# Patient Record
Sex: Male | Born: 1995 | Race: White | Hispanic: No | Marital: Married | Smoking: Former smoker
Health system: Southern US, Community
[De-identification: ages and names within clinical notes are randomized; demographics above are authoritative.]

## PROBLEM LIST (undated history)

## (undated) DIAGNOSIS — R519 Headache, unspecified: Secondary | ICD-10-CM

## (undated) DIAGNOSIS — N200 Calculus of kidney: Secondary | ICD-10-CM

## (undated) DIAGNOSIS — F419 Anxiety disorder, unspecified: Secondary | ICD-10-CM

## (undated) HISTORY — PX: EYE SURGERY: SHX253

---
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
## 2013-02-13 ENCOUNTER — Encounter (HOSPITAL_COMMUNITY): Payer: Self-pay | Admitting: Emergency Medicine

## 2013-02-13 ENCOUNTER — Emergency Department (HOSPITAL_COMMUNITY)
Admission: EM | Admit: 2013-02-13 | Discharge: 2013-02-13 | Disposition: A | Discharge status: Home / Self Care | Payer: BC Managed Care – PPO | Attending: Emergency Medicine | Admitting: Emergency Medicine

## 2013-02-13 DIAGNOSIS — R110 Nausea: Secondary | ICD-10-CM | POA: Insufficient documentation

## 2013-02-13 DIAGNOSIS — R109 Unspecified abdominal pain: Secondary | ICD-10-CM | POA: Insufficient documentation

## 2013-02-13 DIAGNOSIS — Z79899 Other long term (current) drug therapy: Secondary | ICD-10-CM | POA: Insufficient documentation

## 2013-02-13 DIAGNOSIS — N453 Epididymo-orchitis: Secondary | ICD-10-CM | POA: Insufficient documentation

## 2013-02-13 DIAGNOSIS — F17200 Nicotine dependence, unspecified, uncomplicated: Secondary | ICD-10-CM | POA: Insufficient documentation

## 2013-02-13 DIAGNOSIS — N451 Epididymitis: Secondary | ICD-10-CM

## 2013-02-13 MED ORDER — HYDROCODONE-ACETAMINOPHEN 5-325 MG PO TABS: 2.0000 | ORAL_TABLET | Freq: Once | ORAL | Status: DC

## 2013-02-13 MED ORDER — IBUPROFEN 400 MG PO TABS
600.0000 mg | ORAL_TABLET | Freq: Once | ORAL | Status: AC
  Administered 2013-02-13: 600 mg via ORAL
  Filled 2013-02-13: qty 2

## 2013-02-13 MED ORDER — LIDOCAINE HCL (PF) 1 % IJ SOLN
2.0000 mL | Freq: Once | INTRAMUSCULAR | Status: AC
  Administered 2013-02-13: 2 mL via INTRADERMAL
  Filled 2013-02-13: qty 5

## 2013-02-13 MED ORDER — DOXYCYCLINE HYCLATE 100 MG PO TABS: 100.0000 mg | ORAL_TABLET | Freq: Two times a day (BID) | ORAL | Status: DC

## 2013-02-13 MED ORDER — DOXYCYCLINE HYCLATE 100 MG PO TABS
100.0000 mg | ORAL_TABLET | Freq: Once | ORAL | Status: AC
  Administered 2013-02-13: 100 mg via ORAL
  Filled 2013-02-13: qty 1

## 2013-02-13 MED ORDER — IBUPROFEN 600 MG PO TABS: 600.0000 mg | ORAL_TABLET | Freq: Four times a day (QID) | ORAL | Status: DC | PRN

## 2013-02-13 MED ORDER — CEFTRIAXONE SODIUM 250 MG IJ SOLR
250.0000 mg | Freq: Once | INTRAMUSCULAR | Status: AC
  Administered 2013-02-13: 250 mg via INTRAMUSCULAR

## 2013-02-13 NOTE — ED Notes (Signed)
Pt lifts a lot. States started feeling swelling and pain to right groin yesterday. Hurts to move in any way. States can only feel it when he stands up. Grimacing noted.

## 2013-02-13 NOTE — ED Provider Notes (Signed)
CSN: [id number]     Arrival date & time 02/13/13  0807 History  This chart was scribed for Dagmar Hait, MD, by Yevette Edwards, ED Scribe. This patient was seen in room APA12/APA12 and the patient's care was started at 8:17 AM.   First MD Initiated Contact with Patient 02/13/13 [phone]     Chief Complaint  Patient presents with  . Inguinal Hernia   (Consider location/radiation/quality/duration/timing/severity/associated sxs/prior Treatment) Patient is a 17 y.o. male presenting with groin pain. The history is provided by the patient. No language interpreter was used.  Groin Pain This is a new problem. The current episode started yesterday. The problem occurs constantly. The problem has not changed since onset.Pertinent negatives include no abdominal pain and no headaches. The symptoms are aggravated by standing. Nothing relieves the symptoms.   HPI Comments: Steve Colon is a 17 y.o. male who presents to the Emergency Department complaining of a knot to his right groin which began yesterday evening. The pt reports he has experienced swelling and constant pain to the affected area as well as nausea associated with the pain. The pt states that the knot is noticeable only when he is standing, though he reports that the pain is constant. He denies testicular pain, penile pain, penile discharge, fever, emesis, or constipation. He denies a h/o prior symptoms or similar symptoms in any other locations. The pt denies any new sexual partners. He denies any recent injuries to his right extremity.  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   No past medical history on file. Past Surgical History  Procedure Laterality Date  . Eye surgery     No family history on file. History  Substance Use Topics  . Smoking status: Current Every Day Smoker    Types: Cigarettes  . Smokeless tobacco: Not on file  . Alcohol Use: No    Review of Systems  Gastrointestinal: Positive for nausea. Negative for  vomiting, abdominal pain and constipation.  Genitourinary: Negative for dysuria, discharge, penile swelling, penile pain and testicular pain.       Right-sided groin pain.  Neurological: Negative for headaches.  All other systems reviewed and are negative.   Allergies  Codeine  Home Medications   Current Outpatient Rx  Name  Route  Sig  Dispense  Refill  . omeprazole (PRILOSEC) 20 MG capsule   Oral   Take 20 mg by mouth daily.          Triage Vitals: BP 125/78  Pulse 93  Temp(Src) 98.7 F (37.1 C) (Oral)  Resp 18  SpO2 98%  Physical Exam  Nursing note and vitals reviewed. Constitutional: He is oriented to person, place, and time. He appears well-developed and well-nourished. No distress.  HENT:  Head: Normocephalic and atraumatic.  Eyes: EOM are normal.  Neck: Neck supple. No tracheal deviation present.  Cardiovascular: Normal rate.   Pulmonary/Chest: Effort normal. No respiratory distress.  Genitourinary: No penile tenderness.  Right groin pain. Right epididymis pain. No testicular pain. Cremasterics are intact. No penile pain.  Musculoskeletal: Normal range of motion.  Neurological: He is alert and oriented to person, place, and time.  Skin: Skin is warm and dry.  Psychiatric: He has a normal mood and affect. His behavior is normal.     ED Course  Procedures (including critical care time)  DIAGNOSTIC STUDIES:  Oxygen Saturation is 98% on room air, normal by my interpretation.    COORDINATION OF CARE:  8:24 AM- Discussed treatment plan with  patient, and the patient agreed to the plan.   Labs Review Labs Reviewed - No data to display Imaging Review No results found.  MDM   1. Epididymitis    R sided inguinal lymphadenopathy. No abdominal pain, fevers, N/V/D, difficulty with bowel movements. R epididymal tenderness. No testicular pain bilaterally, normal cremasterics. Will treat epididymitis with rocephin and doxycycline. Motrin given for pain.    I personally performed the services described in this documentation, which was scribed in my presence. The recorded information has been reviewed and is accurate.    Dagmar Hait, MD 02/13/13 [phone]

## 2013-03-03 ENCOUNTER — Emergency Department (HOSPITAL_COMMUNITY): Payer: BC Managed Care – PPO

## 2013-03-03 ENCOUNTER — Encounter (HOSPITAL_COMMUNITY): Payer: Self-pay | Admitting: *Deleted

## 2013-03-03 ENCOUNTER — Emergency Department (HOSPITAL_COMMUNITY)
Admission: EM | Admit: 2013-03-03 | Discharge: 2013-03-03 | Disposition: A | Discharge status: Home / Self Care | Payer: BC Managed Care – PPO | Attending: Emergency Medicine | Admitting: Emergency Medicine

## 2013-03-03 DIAGNOSIS — R319 Hematuria, unspecified: Secondary | ICD-10-CM | POA: Insufficient documentation

## 2013-03-03 DIAGNOSIS — F17200 Nicotine dependence, unspecified, uncomplicated: Secondary | ICD-10-CM | POA: Insufficient documentation

## 2013-03-03 DIAGNOSIS — Z87442 Personal history of urinary calculi: Secondary | ICD-10-CM | POA: Insufficient documentation

## 2013-03-03 HISTORY — DX: Calculus of kidney: N20.0

## 2013-03-03 LAB — CBC WITH DIFFERENTIAL/PLATELET
Basophils Absolute: 0 10*3/uL (ref 0.0–0.1)
Eosinophils Relative: 1 % (ref 0–5)
Lymphocytes Relative: 23 % (ref 12–46)
Lymphs Abs: 1.6 10*3/uL (ref 0.7–4.0)
Neutro Abs: 4.7 10*3/uL (ref 1.7–7.7)
Neutrophils Relative %: 68 % (ref 43–77)
Platelets: 134 10*3/uL — ABNORMAL LOW (ref 150–400)
RBC: 5.1 MIL/uL (ref 4.22–5.81)
RDW: 11.9 % (ref 11.5–15.5)
WBC: 7 10*3/uL (ref 4.0–10.5)

## 2013-03-03 LAB — URINE MICROSCOPIC-ADD ON

## 2013-03-03 LAB — BASIC METABOLIC PANEL
CO2: 29 mEq/L (ref 19–32)
Calcium: 10.3 mg/dL (ref 8.4–10.5)
Glucose, Bld: 94 mg/dL (ref 70–99)
Potassium: 4.1 mEq/L (ref 3.5–5.1)
Sodium: 141 mEq/L (ref 135–145)

## 2013-03-03 LAB — URINALYSIS, ROUTINE W REFLEX MICROSCOPIC
Bilirubin Urine: NEGATIVE
Leukocytes, UA: NEGATIVE
Protein, ur: NEGATIVE mg/dL
Specific Gravity, Urine: 1.02 (ref 1.005–1.030)

## 2013-03-03 MED ORDER — HYDROMORPHONE HCL PF 1 MG/ML IJ SOLN
0.5000 mg | Freq: Once | INTRAMUSCULAR | Status: AC
  Administered 2013-03-03: 0.5 mg via INTRAVENOUS
  Filled 2013-03-03: qty 1

## 2013-03-03 MED ORDER — ONDANSETRON HCL 4 MG/2ML IJ SOLN
4.0000 mg | Freq: Once | INTRAMUSCULAR | Status: AC
  Administered 2013-03-03: 4 mg via INTRAVENOUS
  Filled 2013-03-03: qty 2

## 2013-03-03 MED ORDER — HYDROCODONE-ACETAMINOPHEN 5-325 MG PO TABS: 1.0000 | ORAL_TABLET | Freq: Four times a day (QID) | ORAL | Status: DC | PRN

## 2013-03-03 MED ORDER — CEPHALEXIN 500 MG PO CAPS: 500.0000 mg | ORAL_CAPSULE | Freq: Four times a day (QID) | ORAL | Status: DC

## 2013-03-03 MED ORDER — KETOROLAC TROMETHAMINE 30 MG/ML IJ SOLN
30.0000 mg | Freq: Once | INTRAMUSCULAR | Status: AC
  Administered 2013-03-03: 30 mg via INTRAVENOUS
  Filled 2013-03-03: qty 1

## 2013-03-03 NOTE — ED Notes (Signed)
Pain rt low back since Thursday, alert, says he has hx of kidney stones.  Urine is darker than usual.

## 2013-03-03 NOTE — ED Provider Notes (Signed)
CSN: [id number]     Arrival date & time 03/03/13  1055 History  This chart was scribed for Benny Lennert, MD by Quintella Reichert, ED scribe.  This patient was seen in room APA05/APA05 and the patient's care was started at 1:56 PM.    Chief Complaint  Patient presents with  . Flank Pain    Patient is a 17 y.o. male presenting with flank pain. The history is provided by the patient. No language interpreter was used.  Flank Pain This is a new problem. The current episode started 2 days ago. The problem occurs constantly. The problem has been gradually worsening. Pertinent negatives include no chest pain, no abdominal pain, no headaches and no shortness of breath. Associated symptoms comments: Dark urine.  No fever, chills, nausea or vomiting.Marland Kitchen He has tried nothing for the symptoms.    HPI Comments: Steve Colon is a 17 y.o. male with no chronic medical conditions who presents to the Emergency Department complaining of sudden-onset severe right flank pain that began 2 days ago.  Pt also reports his urine is darker than usual.  He denies fever, chills, nausea or vomiting.  He denies prior h/o kidney stones.  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  He does not know what treatment plan was advised and he has not followed up since this diagnosis.  Former PCP is Dr. Dimas Aguas Pt has no PCP presently since losing his insurance   Past Medical History  Diagnosis Date  . Kidney stones     Past Surgical History  Procedure Laterality Date  . Eye surgery      History reviewed. No pertinent family history.  History  Substance Use Topics  . Smoking status: Current Every Day Smoker    Types: Cigarettes  . Smokeless tobacco: Not on file  . Alcohol Use: No     Review of Systems  Constitutional: Negative for appetite change and fatigue.  HENT: Negative for congestion, sinus pressure and ear discharge.   Eyes: Negative for  discharge.  Respiratory: Negative for cough and shortness of breath.   Cardiovascular: Negative for chest pain.  Gastrointestinal: Negative for abdominal pain and diarrhea.  Genitourinary: Positive for flank pain. Negative for frequency.       Dark urine  Musculoskeletal: Negative for back pain.  Skin: Negative for rash.  Neurological: Negative for seizures and headaches.  Psychiatric/Behavioral: Negative for hallucinations.    Allergies  Codeine and Tramadol  Home Medications  No current outpatient prescriptions on file.  BP 126/67  Pulse 67  Temp(Src) 98.5 F (36.9 C) (Oral)  Resp 16  Ht 5\' 6"  (1.676 m)  Wt 130 lb (58.968 kg)  BMI 20.99 kg/m2  SpO2 97%  Physical Exam  Nursing note and vitals reviewed. Constitutional: He is oriented to person, place, and time. He appears well-developed.  HENT:  Head: Normocephalic.  Eyes: Conjunctivae and EOM are normal. No scleral icterus.  Neck: Neck supple. No thyromegaly present.  Cardiovascular: Normal rate and regular rhythm.  Exam reveals no gallop and no friction rub.   No murmur heard. Pulmonary/Chest: No stridor. He has no wheezes. He has no rales. He exhibits no tenderness.  Abdominal: Soft. He exhibits no distension. There is tenderness. There is no rebound.  Moderate right flank tenderness  Musculoskeletal: Normal range of motion. He exhibits no edema.  Lymphadenopathy:    He has no cervical adenopathy.  Neurological: He is  oriented to person, place, and time. Coordination normal.  Skin: No rash noted. No erythema.  Psychiatric: He has a normal mood and affect. His behavior is normal.    ED Course  Procedures (including critical care time)  DIAGNOSTIC STUDIES: Oxygen Saturation is 97% on room air, normal by my interpretation.    COORDINATION OF CARE: 1:59 PM-Discussed treatment plan which includes pain medication, UA and abdomen CT with pt at bedside and pt agreed to plan.   3:54 PM: Informed pt that CT confirms  kidney malrotation and UA reveals some blood in his urine .  Discussed treatment plan which includes pain medication, antibiotics, and referral to urologist.  Pt expressed understanding and agreed to plan.   Labs Review Labs Reviewed  URINALYSIS, ROUTINE W REFLEX MICROSCOPIC - Abnormal; Notable for the following:    APPearance HAZY (*)    Hgb urine dipstick LARGE (*)    All other components within normal limits  URINE MICROSCOPIC-ADD ON - Abnormal; Notable for the following:    Squamous Epithelial / LPF FEW (*)    All other components within normal limits  CBC WITH DIFFERENTIAL - Abnormal; Notable for the following:    Platelets 134 (*)    All other components within normal limits  BASIC METABOLIC PANEL    Imaging Review Ct Abdomen Pelvis Wo Contrast  03/03/2013   CLINICAL DATA:  Right flank pain.  EXAM: CT ABDOMEN AND PELVIS WITHOUT CONTRAST  TECHNIQUE: Multidetector CT imaging of the abdomen and pelvis was performed following the standard protocol without intravenous contrast.  COMPARISON:  CT E 02/24/2013 [facility].  FINDINGS: Renal: The right kidney is malrotated. There is a 4 mm calculus towards the upper pole. There is a 2 mm calculus towards the lower pole of the right kidney. The right renal pelvis is patulous. This could represent an extrarenal pelvis and is unchanged from prior. No clear evidence of hydronephrosis. The right ureter is normal caliber without evidence of ureteral calculus. Left kidney is normal in shape and position. There is a small left extrarenal pelvis. No left ureterolithiasis or obstructive uropathy.  Lung bases are clear. No focal hepatic lesion. The stomach, small bowel, appendix, cecum are normal. The appendix is filled with high-density from recent CT. Rectosigmoid colon is normal.  Abdominal aorta is normal in caliber. No retroperitoneal or periportal lymphadenopathy.  No free fluid the pelvis. No distal ureteral stones or bladder stones. Prostate gland  is normal. No pelvic lymphadenopathy. Review of bone windows demonstrates no aggressive osseous lesions.  IMPRESSION: 1. 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  2. Two nonobstructing right renal calculi. No ureterolithiasis on the left or right.   Electronically Signed   By: Genevive Bi M.D.   On: 03/03/2013 15:05    MDM  No diagnosis found.    The chart was scribed for me under my direct supervision.  I personally performed the history, physical, and medical decision making and all procedures in the evaluation of this patient.Benny Lennert, MD 03/03/13 [phone]

## 2013-03-04 LAB — URINE CULTURE
Colony Count: NO GROWTH
Culture: NO GROWTH

## 2013-03-11 ENCOUNTER — Ambulatory Visit (INDEPENDENT_AMBULATORY_CARE_PROVIDER_SITE_OTHER): Payer: BC Managed Care – PPO | Admitting: Urology

## 2013-03-11 ENCOUNTER — Other Ambulatory Visit: Payer: Self-pay | Admitting: Urology

## 2013-03-11 DIAGNOSIS — N1330 Unspecified hydronephrosis: Secondary | ICD-10-CM

## 2013-03-12 ENCOUNTER — Ambulatory Visit (HOSPITAL_COMMUNITY): Payer: BC Managed Care – PPO

## 2013-03-17 ENCOUNTER — Ambulatory Visit (HOSPITAL_COMMUNITY)
Admission: RE | Admit: 2013-03-17 | Discharge: 2013-03-17 | Disposition: A | Discharge status: Home / Self Care | Payer: BC Managed Care – PPO | Source: Ambulatory Visit | Attending: Urology | Admitting: Urology

## 2013-03-17 DIAGNOSIS — N201 Calculus of ureter: Secondary | ICD-10-CM | POA: Insufficient documentation

## 2013-03-17 DIAGNOSIS — N1330 Unspecified hydronephrosis: Secondary | ICD-10-CM | POA: Insufficient documentation

## 2013-04-08 ENCOUNTER — Other Ambulatory Visit (HOSPITAL_COMMUNITY): Payer: Self-pay | Admitting: Urology

## 2013-04-08 DIAGNOSIS — N135 Crossing vessel and stricture of ureter without hydronephrosis: Secondary | ICD-10-CM

## 2013-04-09 ENCOUNTER — Other Ambulatory Visit: Payer: Self-pay | Admitting: Urology

## 2013-04-18 ENCOUNTER — Encounter (HOSPITAL_COMMUNITY): Payer: Self-pay | Admitting: Pharmacy Technician

## 2013-04-22 ENCOUNTER — Other Ambulatory Visit (HOSPITAL_COMMUNITY): Payer: Self-pay | Admitting: *Deleted

## 2013-04-23 ENCOUNTER — Encounter (HOSPITAL_COMMUNITY)
Admission: RE | Admit: 2013-04-23 | Discharge: 2013-04-23 | Disposition: A | Discharge status: Home / Self Care | Payer: BC Managed Care – PPO | Source: Ambulatory Visit | Attending: Urology | Admitting: Urology

## 2013-04-23 ENCOUNTER — Encounter (HOSPITAL_COMMUNITY): Payer: Self-pay

## 2013-04-23 DIAGNOSIS — Z01818 Encounter for other preprocedural examination: Secondary | ICD-10-CM | POA: Insufficient documentation

## 2013-04-23 DIAGNOSIS — Z01812 Encounter for preprocedural laboratory examination: Secondary | ICD-10-CM | POA: Insufficient documentation

## 2013-04-23 DIAGNOSIS — Z01810 Encounter for preprocedural cardiovascular examination: Secondary | ICD-10-CM | POA: Insufficient documentation

## 2013-04-23 LAB — CBC
HCT: 44.6 % (ref 39.0–52.0)
MCH: 30.9 pg (ref 26.0–34.0)
MCHC: 33.9 g/dL (ref 30.0–36.0)
MCV: 91.4 fL (ref 78.0–100.0)
Platelets: 152 10*3/uL (ref 150–400)
RBC: 4.88 MIL/uL (ref 4.22–5.81)
RDW: 11.9 % (ref 11.5–15.5)

## 2013-04-23 LAB — ABO/RH: ABO/RH(D): O POS

## 2013-04-23 LAB — BASIC METABOLIC PANEL
BUN: 13 mg/dL (ref 6–23)
CO2: 28 mEq/L (ref 19–32)
Calcium: 10 mg/dL (ref 8.4–10.5)
Chloride: 104 mEq/L (ref 96–112)
Creatinine, Ser: 0.86 mg/dL (ref 0.50–1.35)
GFR calc Af Amer: >90 mL/min (ref >90)
GFR calc non Af Amer: >90 mL/min (ref >90)
Sodium: 140 mEq/L (ref 135–145)

## 2013-04-23 NOTE — Patient Instructions (Addendum)
[address] RIT[address] LECOMTE  04/23/2013   Your procedure is scheduled on: Monday November 10th  Report to [facility] at 515  AM.  Call this number if you have problems the morning of surgery [phone]   Remember: follow all bowel pre instructions from dr borden   Do not eat food :After Midnight. Saturday night,               Clear liquids all day Sunday 04-27-13 per dr Laverle Patter, no liquid after midnight Sunday night.    Take these medicines the morning of surgery with A SIP OF WATER: no meds to take                                SEE [facility] PREPARING FOR SURGERY SHEET             You may not have any metal on your body including hair pins and piercings  Do not wear jewelry, make-up.  Do not wear lotions, powders, or perfumes. You may wear deodorant.   Men may shave face and neck.  Do not bring valuables to the hospital. [facility] IS NOT RESPONSIBLE FOR VALUEABLES.  Contacts, dentures or bridgework may not be worn into surgery.  Leave suitcase in the car. After surgery it may be brought to your room.  For patients admitted to the hospital, checkout time is 11:00 AM the day of discharge.   Patients discharged the day of surgery will not be allowed to drive home.  Name and phone number of your driver:  Special Instructions: N/A   Please read over the following fact sheets that you were given:   Call Cain Sieve RN pre op nurse if needed [phone]    FAILURE TO FOLLOW THESE INSTRUCTIONS MAY RESULT IN THE CANCELLATION OF YOUR SURGERY.  PATIENT SIGNATURE___________________________________________  NURSE SIGNATURE_____________________________________________

## 2013-04-23 NOTE — Progress Notes (Signed)
Chest x ray 2 view [facility] 01-07-13 on chart

## 2013-04-24 ENCOUNTER — Encounter (HOSPITAL_COMMUNITY)
Admission: RE | Admit: 2013-04-24 | Discharge: 2013-04-24 | Disposition: A | Discharge status: Home / Self Care | Payer: BC Managed Care – PPO | Source: Ambulatory Visit | Attending: Urology | Admitting: Urology

## 2013-04-24 ENCOUNTER — Encounter (HOSPITAL_COMMUNITY): Payer: Self-pay

## 2013-04-24 DIAGNOSIS — N135 Crossing vessel and stricture of ureter without hydronephrosis: Secondary | ICD-10-CM | POA: Insufficient documentation

## 2013-04-24 MED ORDER — TECHNETIUM TC 99M MERTIATIDE
15.6000 | Freq: Once | INTRAVENOUS | Status: AC | PRN
  Administered 2013-04-24: 15.6 via INTRAVENOUS

## 2013-04-24 MED ORDER — FUROSEMIDE 10 MG/ML IJ SOLN
30.0000 mg | Freq: Once | INTRAMUSCULAR | Status: AC
  Administered 2013-04-24: 30 mg via INTRAVENOUS
  Filled 2013-04-24: qty 4

## 2013-04-26 NOTE — H&P (Signed)
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 Marcine Matar PCP: None   History of Present Illness  Steve Colon is a 17 year old patient who has a long standing history of intermitent right flank pain.  He states that this has been going on for the last 3 or 4 years intermittently and occur approximately twice per month.  He has not noted any associated risk factors for his pain or any exacerbating issues.  He also notes no factors that will improve his symptoms once they develop.  Over the past few months, his pain became more severe and occurred more frequently.  He presented to the [facility] ED in early September for severe right flank pain and was found to have right hydronephrosis with a malrotated right kidney and evidence of mild cortical thinning of the right kidney consistent with probable right UPJ obstruction.  He was taken to the OR by Dr. Wendall Stade who performed right retrograde pyelography demonstrating a normal caliber right ureter with a transition point at the right UPJ with right hydronephrosis. A ureteral stent was placed. He had some initial relief of his pain but has since continued to have pain likely related to his stent.  He states that his pain at this point is pretty similar to how was prior to his stent being placed and then have lower urinary tract symptoms related to the stent including urinary frequency and dysuria.  He currently has been taking Dilaudid for pain control.  His CT scan also incidentally demonstrated two renal calculi in the right kidney measuring 2 and 4 mm.  He has denied any hematuria, history of urinary tract infections, or symptomatic episodes of urolithiasis.  He has no history of renal dysfunction.   Past Medical History Problems  1. History of  Anxiety  (Symptom) 300.00  Surgical History Problems  1. History of  Eye Surgery  Current Meds 1. HYDROmorphone HCl 2 MG Oral Tablet; TAKE 1 TABLET Every 6 hours PRN; Therapy:  14Oct2014 to (Last Rx:14Oct2014) 2. Tamsulosin HCl 0.4 MG Oral Capsule; TAKE 1 CAPSULE Bedtime; Therapy: 30Sep2014  to (Last Rx:30Sep2014)  Requested for: 30Sep2014  Allergies Medication  1. Toradol Oral TABS 2. Tramadol   He states that he has had anaphylaxis to both Toradol and tramadol.   Family History Problems  1. Family history of  Nephrolithiasis  Social History Problems    Marital History - Single   Occupation: [employer]   Tobacco Use 305.1 1/2 pack for 2 years Denied    History of  Alcohol Use  Review of Systems Constitutional, skin, eye, otolaryngeal, hematologic/lymphatic, cardiovascular, pulmonary, endocrine, musculoskeletal, gastrointestinal, neurological and psychiatric system(s) were reviewed and pertinent findings if present are noted.  Genitourinary: urinary frequency and dysuria.  Gastrointestinal: no nausea and no vomiting.  Constitutional: no fever.  Cardiovascular: no leg swelling.  Musculoskeletal: back pain.    Vitals Vital Signs [Data Includes: Last 1 Day]  21Oct2014 11:04AM  Blood Pressure: 121 / 80 Heart Rate: 76  Physical Exam Constitutional: Well nourished and well developed . No acute distress.  ENT:. The ears and nose are normal in appearance.  Neck: The appearance of the neck is normal and no neck mass is present.  Pulmonary: No respiratory distress, normal respiratory rhythm and effort and clear bilateral breath sounds.  Cardiovascular: Heart rate and  rhythm are normal . No peripheral edema.  Abdomen: The abdomen is flat. The abdomen is soft and nontender. No masses are palpated. No hernias are palpable. No hepatosplenomegaly noted. He had severe tenderness on palpation of his right flank even when barely pushing on his flank.  Lymphatics: The femoral and inguinal  nodes are not enlarged or tender.  Skin: Normal skin turgor, no visible rash and no visible skin lesions.  Neuro/Psych:. Mood and affect are appropriate.    Results/Data Urine [Data Includes: Last 1 Day]   21Oct2014  COLOR AMBER   APPEARANCE CLEAR   SPECIFIC GRAVITY 1.025   pH 6.0   GLUCOSE NEG mg/dL  BILIRUBIN NEG   KETONE NEG mg/dL  BLOOD LARGE   PROTEIN 30 mg/dL  UROBILINOGEN 0.2 mg/dL  NITRITE NEG   LEUKOCYTE ESTERASE TRACE   SQUAMOUS EPITHELIAL/HPF RARE   WBC 3-6 WBC/hpf  RBC 21-50 RBC/hpf  BACTERIA RARE   CRYSTALS NONE SEEN   CASTS NONE SEEN   Other MUCUS NOTED     I have reviewed his medical records and independently reviewed his CT scan with contrast and a CT scan without contrast as well as retrograde pyelography studies.  Findings are as dictated above.   Assessment Assessed  1. 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 Diazepam 10 MG Oral Tablet; TAKE 1 TABLET As Directed Take 30 min prior to  procedure; Therapy: 21Oct2014 to (Complete:22Oct2014); Last Rx:21Oct2014 2. HYDROmorphone HCl 2 MG Oral Tablet; TAKE 1 TABLET EVERY 3 TO 4 HOURS AS  NEEDED FOR PAIN; Therapy: 21Oct2014 to (Evaluate:26Oct2014); Last Rx:21Oct2014 3. Cysto Removal JJ(s)  Requested for: 29Oct2014 4. Follow-up Office  Follow-up  Requested for: 29Oct2014 Health Maintenance (V70.0)  5. UA With REFLEX  Done: 21Oct2014 10:44AM  Discussion/Summary     1.  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  This certainly could be an explanation for his pain symptoms which he has been experiencing intermittently for the last few years.  However, his pain symptoms are quite unusual and his physical exam findings today are not necessarily consistent with a typical UPJ obstruction.  We  therefore discussed the distinct possibility that his pain could be related to alternative etiology and treatment of his UPJ obstruction might not improve his pain.  Furthermore, his pain has not been improved by his recent ureteral stent placement which is somewhat concerning and again is less consistent with a UPJ obstruction as the source of this pain.  Considering this fact, I have recommended that his ureteral stent be removed since it is only causing him lower urinary tract symptoms and has not improved his pain.  Following removal of the stone, he will then undergo a nuclear medicine renal scan with Lasix for further assessment.  If findings are consistent with a right UPJ obstruction, we have discussed proceeding with cystoscopy, right retrograde pyelography, right ureteral stent placement, and right robotic-assisted laparoscopic pyeloplasty along with intraoperative removal of his right renal calculi.  We have reviewed the potential risks and complications as well as the expected recovery process associated with this procedure.  We have discussed the risks including but not limited to bleeding, infection, cardiopulmonary risks associated with major surgery and anesthesia, risks of damage to adjacent organs/structures, potential loss of kidney or damage to the drainage system of the kidney, potential  failure of the procedure or the potential to have persistent pain afterwards, the potential development of stricture within the ureter, the need for further procedures, the risk of urine leak, possible risk of open surgical conversion, the potential risk for hernia formation, etc.  We discussed appropriate postoperative expectations including the need for postoperative ureteral stent and postoperative drain.  He requested and was given a prescription for Dilaudid 2 mg po q3 to 4 hours prn. # 40.  Cc: Dr. Marcine Matar Dr. Selinda Flavin Dr. Wendall Stade     Signatures Electronically signed by :  Heloise Purpura, M.D.; Apr 08 2013  2:35PM

## 2013-04-27 ENCOUNTER — Encounter (HOSPITAL_COMMUNITY): Payer: Self-pay | Admitting: Anesthesiology

## 2013-04-27 NOTE — Anesthesia Preprocedure Evaluation (Addendum)
Anesthesia Evaluation  Patient identified by MRN, date of birth, ID band Patient awake    Reviewed: Allergy & Precautions, H&P , NPO status , Patient's Chart, lab work & pertinent test results  Airway Mallampati: II TM Distance: >3 FB Neck ROM: Full    Dental no notable dental hx.    Pulmonary Current Smoker,  breath sounds clear to auscultation  Pulmonary exam normal       Cardiovascular Exercise Tolerance: Good negative cardio ROS  Rhythm:Regular Rate:Normal     Neuro/Psych negative neurological ROS  negative psych ROS   GI/Hepatic negative GI ROS, Neg liver ROS,   Endo/Other  negative endocrine ROS  Renal/GU Renal diseaseRight UPJ obstruction and right renal calculi  negative genitourinary   Musculoskeletal negative musculoskeletal ROS (+)   Abdominal   Peds negative pediatric ROS (+)  Hematology negative hematology ROS (+)   Anesthesia Other Findings   Reproductive/Obstetrics negative OB ROS                          Anesthesia Physical Anesthesia Plan  ASA: II  Anesthesia Plan: General   Post-op Pain Management:    Induction: Intravenous  Airway Management Planned: Oral ETT  Additional Equipment:   Intra-op Plan:   Post-operative Plan: Extubation in OR  Informed Consent: I have reviewed the patients History and Physical, chart, labs and discussed the procedure including the risks, benefits and alternatives for the proposed anesthesia with the patient or authorized representative who has indicated his/her understanding and acceptance.   Dental advisory given  Plan Discussed with: CRNA  Anesthesia Plan Comments:         Anesthesia Quick Evaluation

## 2013-04-28 ENCOUNTER — Ambulatory Visit (HOSPITAL_COMMUNITY): Payer: BC Managed Care – PPO

## 2013-04-28 ENCOUNTER — Encounter (HOSPITAL_COMMUNITY): Payer: BC Managed Care – PPO | Admitting: Anesthesiology

## 2013-04-28 ENCOUNTER — Encounter (HOSPITAL_COMMUNITY): Payer: Self-pay | Admitting: *Deleted

## 2013-04-28 ENCOUNTER — Observation Stay (HOSPITAL_COMMUNITY)
Admission: RE | Admit: 2013-04-28 | Discharge: 2013-04-30 | Disposition: A | Discharge status: Home / Self Care | Payer: BC Managed Care – PPO | Source: Ambulatory Visit | Attending: Urology | Admitting: Urology

## 2013-04-28 ENCOUNTER — Encounter (HOSPITAL_COMMUNITY)
Admission: RE | Disposition: A | Discharge status: Home / Self Care | Payer: Self-pay | Source: Ambulatory Visit | Attending: Urology

## 2013-04-28 ENCOUNTER — Observation Stay (HOSPITAL_COMMUNITY): Payer: BC Managed Care – PPO

## 2013-04-28 ENCOUNTER — Ambulatory Visit (HOSPITAL_COMMUNITY): Payer: BC Managed Care – PPO | Admitting: Anesthesiology

## 2013-04-28 DIAGNOSIS — Q6239 Other obstructive defects of renal pelvis and ureter: Secondary | ICD-10-CM | POA: Insufficient documentation

## 2013-04-28 DIAGNOSIS — F17200 Nicotine dependence, unspecified, uncomplicated: Secondary | ICD-10-CM | POA: Insufficient documentation

## 2013-04-28 DIAGNOSIS — F411 Generalized anxiety disorder: Secondary | ICD-10-CM | POA: Insufficient documentation

## 2013-04-28 DIAGNOSIS — Q638 Other specified congenital malformations of kidney: Secondary | ICD-10-CM | POA: Insufficient documentation

## 2013-04-28 DIAGNOSIS — R109 Unspecified abdominal pain: Secondary | ICD-10-CM | POA: Insufficient documentation

## 2013-04-28 DIAGNOSIS — Z79899 Other long term (current) drug therapy: Secondary | ICD-10-CM | POA: Insufficient documentation

## 2013-04-28 DIAGNOSIS — N200 Calculus of kidney: Principal | ICD-10-CM | POA: Insufficient documentation

## 2013-04-28 HISTORY — PX: CYSTOSCOPY W/ URETERAL STENT PLACEMENT: SHX1429

## 2013-04-28 HISTORY — PX: ROBOT ASSISTED PYELOPLASTY: SHX5143

## 2013-04-28 LAB — BASIC METABOLIC PANEL
CO2: 24 mEq/L (ref 19–32)
Calcium: 9.5 mg/dL (ref 8.4–10.5)
Creatinine, Ser: 0.99 mg/dL (ref 0.50–1.35)
GFR calc Af Amer: >90 mL/min (ref >90)
Glucose, Bld: 115 mg/dL — ABNORMAL HIGH (ref 70–99)
Potassium: 3.7 mEq/L (ref 3.5–5.1)
Sodium: 138 mEq/L (ref 135–145)

## 2013-04-28 LAB — HEMOGLOBIN AND HEMATOCRIT, BLOOD
HCT: 42.5 % (ref 39.0–52.0)
Hemoglobin: 14.9 g/dL (ref 13.0–17.0)

## 2013-04-28 LAB — TYPE AND SCREEN
ABO/RH(D): O POS
Antibody Screen: NEGATIVE

## 2013-04-28 SURGERY — ROBOTIC ASSISTED PYELOPLASTY
Anesthesia: General | Laterality: Right | Wound class: Clean

## 2013-04-28 MED ORDER — IOHEXOL 300 MG/ML  SOLN
INTRAMUSCULAR | Status: DC | PRN
  Administered 2013-04-28: 10 mL

## 2013-04-28 MED ORDER — HYDROMORPHONE HCL PF 1 MG/ML IJ SOLN
INTRAMUSCULAR | Status: AC
  Administered 2013-04-29: 1 mg via INTRAVENOUS
  Filled 2013-04-28: qty 1

## 2013-04-28 MED ORDER — PROMETHAZINE HCL 25 MG/ML IJ SOLN: 6.2500 mg | INTRAMUSCULAR | Status: DC | PRN

## 2013-04-28 MED ORDER — GLYCOPYRROLATE 0.2 MG/ML IJ SOLN
INTRAMUSCULAR | Status: DC | PRN
  Administered 2013-04-28: .2 mg via INTRAVENOUS
  Administered 2013-04-28: .4 mg via INTRAVENOUS

## 2013-04-28 MED ORDER — HYDROMORPHONE HCL PF 1 MG/ML IJ SOLN
0.2500 mg | INTRAMUSCULAR | Status: DC | PRN
  Administered 2013-04-28 (×2): 0.5 mg via INTRAVENOUS

## 2013-04-28 MED ORDER — ONDANSETRON HCL 4 MG/2ML IJ SOLN
INTRAMUSCULAR | Status: DC | PRN
  Administered 2013-04-28: 4 mg via INTRAVENOUS

## 2013-04-28 MED ORDER — NEOSTIGMINE METHYLSULFATE 1 MG/ML IJ SOLN
INTRAMUSCULAR | Status: DC | PRN
  Administered 2013-04-28: 2 mg via INTRAVENOUS
  Administered 2013-04-28: 3 mg via INTRAVENOUS

## 2013-04-28 MED ORDER — CEFAZOLIN SODIUM-DEXTROSE 2-3 GM-% IV SOLR
2.0000 g | INTRAVENOUS | Status: AC
  Administered 2013-04-28: 2 g via INTRAVENOUS

## 2013-04-28 MED ORDER — DEXAMETHASONE SODIUM PHOSPHATE 10 MG/ML IJ SOLN
INTRAMUSCULAR | Status: DC | PRN
  Administered 2013-04-28: 10 mg via INTRAVENOUS

## 2013-04-28 MED ORDER — MIDAZOLAM HCL 5 MG/5ML IJ SOLN
INTRAMUSCULAR | Status: DC | PRN
  Administered 2013-04-28: 1 mg via INTRAVENOUS
  Administered 2013-04-28: 2 mg via INTRAVENOUS
  Administered 2013-04-28: 1 mg via INTRAVENOUS

## 2013-04-28 MED ORDER — DIPHENHYDRAMINE HCL 12.5 MG/5ML PO ELIX
12.5000 mg | ORAL_SOLUTION | Freq: Four times a day (QID) | ORAL | Status: DC | PRN
  Filled 2013-04-28: qty 5

## 2013-04-28 MED ORDER — ROCURONIUM BROMIDE 100 MG/10ML IV SOLN
INTRAVENOUS | Status: DC | PRN
  Administered 2013-04-28: 40 mg via INTRAVENOUS
  Administered 2013-04-28 (×2): 30 mg via INTRAVENOUS
  Administered 2013-04-28: 10 mg via INTRAVENOUS

## 2013-04-28 MED ORDER — HYDROMORPHONE HCL PF 1 MG/ML IJ SOLN
0.5000 mg | INTRAMUSCULAR | Status: DC | PRN
  Administered 2013-04-28 – 2013-04-29 (×4): 1 mg via INTRAVENOUS
  Filled 2013-04-28 (×4): qty 1

## 2013-04-28 MED ORDER — HYDROMORPHONE HCL PF 1 MG/ML IJ SOLN
INTRAMUSCULAR | Status: DC | PRN
  Administered 2013-04-28: 0.5 mg via INTRAVENOUS
  Administered 2013-04-28: 1 mg via INTRAVENOUS
  Administered 2013-04-28: 0.5 mg via INTRAVENOUS

## 2013-04-28 MED ORDER — DOCUSATE SODIUM 100 MG PO CAPS
100.0000 mg | ORAL_CAPSULE | Freq: Two times a day (BID) | ORAL | Status: DC
  Administered 2013-04-28 – 2013-04-30 (×4): 100 mg via ORAL
  Filled 2013-04-28 (×5): qty 1

## 2013-04-28 MED ORDER — ONDANSETRON HCL 4 MG/2ML IJ SOLN: 4.0000 mg | INTRAMUSCULAR | Status: DC | PRN

## 2013-04-28 MED ORDER — ZOLPIDEM TARTRATE 5 MG PO TABS: 5.0000 mg | ORAL_TABLET | Freq: Every evening | ORAL | Status: DC | PRN

## 2013-04-28 MED ORDER — PROPOFOL 10 MG/ML IV BOLUS
INTRAVENOUS | Status: DC | PRN
  Administered 2013-04-28: 130 mg via INTRAVENOUS

## 2013-04-28 MED ORDER — LIDOCAINE HCL (CARDIAC) 20 MG/ML IV SOLN
INTRAVENOUS | Status: DC | PRN
  Administered 2013-04-28: 100 mg via INTRAVENOUS

## 2013-04-28 MED ORDER — KETAMINE HCL 10 MG/ML IJ SOLN
INTRAMUSCULAR | Status: DC | PRN
  Administered 2013-04-28 (×2): 30 mg via INTRAVENOUS

## 2013-04-28 MED ORDER — KCL IN DEXTROSE-NACL 20-5-0.45 MEQ/L-%-% IV SOLN
INTRAVENOUS | Status: DC
  Administered 2013-04-28 – 2013-04-29 (×3): via INTRAVENOUS
  Filled 2013-04-28 (×4): qty 1000

## 2013-04-28 MED ORDER — FENTANYL CITRATE 0.05 MG/ML IJ SOLN
INTRAMUSCULAR | Status: DC | PRN
  Administered 2013-04-28 (×2): 50 ug via INTRAVENOUS
  Administered 2013-04-28: 150 ug via INTRAVENOUS
  Administered 2013-04-28 (×2): 50 ug via INTRAVENOUS
  Administered 2013-04-28: 100 ug via INTRAVENOUS
  Administered 2013-04-28: 50 ug via INTRAVENOUS

## 2013-04-28 MED ORDER — ACETAMINOPHEN 10 MG/ML IV SOLN
1000.0000 mg | Freq: Four times a day (QID) | INTRAVENOUS | Status: AC
  Administered 2013-04-28 – 2013-04-29 (×4): 1000 mg via INTRAVENOUS
  Filled 2013-04-28 (×4): qty 100

## 2013-04-28 MED ORDER — ACETAMINOPHEN 325 MG PO TABS: 650.0000 mg | ORAL_TABLET | ORAL | Status: DC | PRN

## 2013-04-28 MED ORDER — CEFAZOLIN SODIUM 1-5 GM-% IV SOLN
1.0000 g | Freq: Three times a day (TID) | INTRAVENOUS | Status: AC
Start: 2013-04-28 — End: 2013-04-29
  Administered 2013-04-28 – 2013-04-29 (×2): 1 g via INTRAVENOUS
  Filled 2013-04-28 (×2): qty 50

## 2013-04-28 MED ORDER — LACTATED RINGERS IV SOLN
INTRAVENOUS | Status: DC | PRN
  Administered 2013-04-28 (×3): via INTRAVENOUS

## 2013-04-28 MED ORDER — CEFAZOLIN SODIUM-DEXTROSE 2-3 GM-% IV SOLR
INTRAVENOUS | Status: AC
  Filled 2013-04-28: qty 50

## 2013-04-28 MED ORDER — DIPHENHYDRAMINE HCL 50 MG/ML IJ SOLN
12.5000 mg | Freq: Four times a day (QID) | INTRAMUSCULAR | Status: DC | PRN
  Administered 2013-04-28 – 2013-04-30 (×5): 12.5 mg via INTRAVENOUS
  Filled 2013-04-28 (×6): qty 1

## 2013-04-28 MED ORDER — BUPIVACAINE LIPOSOME 1.3 % IJ SUSP
20.0000 mL | Freq: Once | INTRAMUSCULAR | Status: AC
Start: 2013-04-28 — End: 2013-04-28
  Administered 2013-04-28: 20 mL
  Filled 2013-04-28: qty 20

## 2013-04-28 MED ORDER — LACTATED RINGERS IR SOLN
Status: DC | PRN
  Administered 2013-04-28: 1000 mL

## 2013-04-28 MED ORDER — WATER FOR IRRIGATION, STERILE IR SOLN
Status: DC | PRN
  Administered 2013-04-28: 1000 mL via SURGICAL_CAVITY

## 2013-04-28 MED ORDER — BUPIVACAINE-EPINEPHRINE PF 0.25-1:200000 % IJ SOLN
INTRAMUSCULAR | Status: AC
  Filled 2013-04-28: qty 30

## 2013-04-28 MED ORDER — INFLUENZA VAC SPLIT QUAD 0.5 ML IM SUSP
0.5000 mL | INTRAMUSCULAR | Status: AC
  Administered 2013-04-29: 0.5 mL via INTRAMUSCULAR
  Filled 2013-04-28 (×2): qty 0.5

## 2013-04-28 SURGICAL SUPPLY — 69 items
APPLICATOR SURGIFLO ENDO (HEMOSTASIS) ×2 IMPLANT
BAG URO CATCHER STRL LF (DRAPE) ×2 IMPLANT
CANISTER OMNI JUG 16 LITER (MISCELLANEOUS) ×2 IMPLANT
CANISTER SUCTION 2500CC (MISCELLANEOUS) ×2 IMPLANT
CATH INTERMIT  6FR 70CM (CATHETERS) ×2 IMPLANT
CHLORAPREP W/TINT 26ML (MISCELLANEOUS) ×2 IMPLANT
CLIP LIGATING HEM O LOK PURPLE (MISCELLANEOUS) IMPLANT
CLIP LIGATING HEMO O LOK GREEN (MISCELLANEOUS) IMPLANT
CLIP SUT LAPRA TY ABSORB (SUTURE) ×2 IMPLANT
CLOTH BEACON ORANGE TIMEOUT ST (SAFETY) ×2 IMPLANT
CORD HIGH FREQUENCY UNIPOLAR (ELECTROSURGICAL) ×2 IMPLANT
CORDS BIPOLAR (ELECTRODE) ×2 IMPLANT
COVER TIP SHEARS 8 DVNC (MISCELLANEOUS) ×1 IMPLANT
COVER TIP SHEARS 8MM DA VINCI (MISCELLANEOUS) ×1
DECANTER SPIKE VIAL GLASS SM (MISCELLANEOUS) ×2 IMPLANT
DERMABOND ADVANCED (GAUZE/BANDAGES/DRESSINGS) ×1
DERMABOND ADVANCED .7 DNX12 (GAUZE/BANDAGES/DRESSINGS) ×1 IMPLANT
DRAIN CHANNEL 15F RND FF 3/16 (WOUND CARE) ×2 IMPLANT
DRAPE CAMERA CLOSED 9X96 (DRAPES) ×2 IMPLANT
DRAPE INCISE IOBAN 66X45 STRL (DRAPES) ×2 IMPLANT
DRAPE LAPAROSCOPIC ABDOMINAL (DRAPES) ×2 IMPLANT
DRAPE LG THREE QUARTER DISP (DRAPES) ×2 IMPLANT
DRAPE TABLE BACK 44X90 PK DISP (DRAPES) ×2 IMPLANT
DRAPE UTILITY XL STRL (DRAPES) IMPLANT
DRAPE WARM FLUID 44X44 (DRAPE) ×2 IMPLANT
ELECT REM PT RETURN 9FT ADLT (ELECTROSURGICAL) ×2
ELECTRODE REM PT RTRN 9FT ADLT (ELECTROSURGICAL) ×1 IMPLANT
EVACUATOR SILICONE 100CC (DRAIN) ×2 IMPLANT
GLOVE BIOGEL M STRL SZ7.5 (GLOVE) ×6 IMPLANT
GOWN PREVENTION PLUS LG XLONG (DISPOSABLE) ×8 IMPLANT
GOWN STRL REIN XL XLG (GOWN DISPOSABLE) ×6 IMPLANT
GUIDEWIRE STR DUAL SENSOR (WIRE) ×2 IMPLANT
HEMOSTAT SURGICEL 4X8 (HEMOSTASIS) ×2 IMPLANT
KIT ACCESSORY DA VINCI DISP (KITS) ×1
KIT ACCESSORY DVNC DISP (KITS) ×1 IMPLANT
KIT BASIN OR (CUSTOM PROCEDURE TRAY) ×2 IMPLANT
LUBRICANT JELLY ST 5GR 8946 (MISCELLANEOUS) IMPLANT
NS IRRIG 1000ML POUR BTL (IV SOLUTION) IMPLANT
PACK CYSTO (CUSTOM PROCEDURE TRAY) ×2 IMPLANT
PENCIL BUTTON HOLSTER BLD 10FT (ELECTRODE) ×2 IMPLANT
POSITIONER SURGICAL ARM (MISCELLANEOUS) ×2 IMPLANT
SET IRRIG Y TYPE TUR BLADDER L (SET/KITS/TRAYS/PACK) ×2 IMPLANT
SET TUBE IRRIG SUCTION NO TIP (IRRIGATION / IRRIGATOR) ×2 IMPLANT
SOLUTION ANTI FOG 6CC (MISCELLANEOUS) ×2 IMPLANT
SOLUTION ELECTROLUBE (MISCELLANEOUS) ×2 IMPLANT
SPONGE LAP 18X18 X RAY DECT (DISPOSABLE) IMPLANT
SPONGE LAP 4X18 X RAY DECT (DISPOSABLE) ×2 IMPLANT
STAPLER VISISTAT 35W (STAPLE) ×2 IMPLANT
STENT CONTOUR NO GW 8FR 26CM (STENTS) ×2 IMPLANT
SURGIFLO W/THROMBIN 8M KIT (HEMOSTASIS) ×2 IMPLANT
SUT ETHILON 3 0 PS 1 (SUTURE) ×2 IMPLANT
SUT MNCRL AB 4-0 PS2 18 (SUTURE) ×4 IMPLANT
SUT VIC AB 0 CT1 27 (SUTURE) ×1
SUT VIC AB 0 CT1 27XBRD ANTBC (SUTURE) ×1 IMPLANT
SUT VIC AB 0 UR5 27 (SUTURE) ×2 IMPLANT
SUT VIC AB 2-0 SH 27 (SUTURE) ×3
SUT VIC AB 2-0 SH 27X BRD (SUTURE) ×3 IMPLANT
SUT VIC AB 4-0 RB1 27 (SUTURE) ×4
SUT VIC AB 4-0 RB1 27XBRD (SUTURE) ×4 IMPLANT
SUT VICRYL 0 UR6 27IN ABS (SUTURE) ×2 IMPLANT
SYR BULB IRRIGATION 50ML (SYRINGE) IMPLANT
TOWEL OR NON WOVEN STRL DISP B (DISPOSABLE) ×2 IMPLANT
TRAY FOLEY CATH 14FRSI W/METER (CATHETERS) ×2 IMPLANT
TRAY LAP CHOLE (CUSTOM PROCEDURE TRAY) ×2 IMPLANT
TROCAR ENDOPATH XCEL 12X100 BL (ENDOMECHANICALS) ×2 IMPLANT
TROCAR XCEL 12X100 BLDLESS (ENDOMECHANICALS) ×2 IMPLANT
TUBING CONNECTING 10 (TUBING) ×2 IMPLANT
TUBING INSUFFLATION 10FT LAP (TUBING) ×2 IMPLANT
WATER STERILE IRR 1500ML POUR (IV SOLUTION) IMPLANT

## 2013-04-28 NOTE — Anesthesia Procedure Notes (Addendum)
Performed by: Edison Pace   Performed by: Edison Pace

## 2013-04-28 NOTE — Interval H&P Note (Signed)
History and Physical Interval Note:  04/28/2013 7:05 AM  Steve Colon  has presented today for surgery, with the diagnosis of RIGHT URETEROPELVIC JUNCTION OBSTRUCTION, RIGHT RENAL CALCULI  The various methods of treatment have been discussed with the patient and family. After consideration of risks, benefits and other options for treatment, the patient has consented to  Procedure(s): ROBOTIC ASSISTED PYELOPLASTY/ RIGHT PYELOLITHOTOMY (Right) CYSTOSCOPY WITH RIGHT RETROGRADE PYELOGRAM/RIGHT URETERAL STENT PLACEMENT (Right) as a surgical intervention .  The patient's history has been reviewed, patient examined, no change in status, stable for surgery.  I have reviewed the patient's chart and labs.  Questions were answered to the patient's satisfaction.     [name],LES

## 2013-04-28 NOTE — Progress Notes (Signed)
Lab and X-ray results noted.

## 2013-04-28 NOTE — Transfer of Care (Signed)
Immediate Anesthesia Transfer of Care Note  Patient: Steve Colon  Procedure(s) Performed: Procedure(s): ROBOTIC ASSISTED PYELOPLASTY/ RIGHT PYELOLITHOTOMY (Right) CYSTOSCOPY WITH RIGHT RETROGRADE PYELOGRAM/RIGHT URETERAL STENT PLACEMENT (Right)  Patient Location: PACU  Anesthesia Type:General  Level of Consciousness: sedated  Airway & Oxygen Therapy: Patient Spontanous Breathing and Patient connected to face mask oxygen  Post-op Assessment: Report given to PACU RN and Post -op Vital signs reviewed and stable  Post vital signs: Reviewed and stable  Complications: No apparent anesthesia complications

## 2013-04-28 NOTE — Progress Notes (Signed)
Portable Abdominal X-ray done.

## 2013-04-28 NOTE — Op Note (Addendum)
Preoperative diagnosis: Right ureteropelvic junction obstruction, right renal calculi  Postoperative diagnosis: Right ureteropelvic junction obstruction, right renal calculi  Procedure:  1. Cystoscopy 2. Right retrograde pyelography with interpretation 3. Right ureteral stent placement (8 x 26) 4. Right robotic-assisted laparoscopic dismembered pyeloplasty 5. Right ureterorenoscopy  Surgeon: Moody Bruins. M.D.  Assistant(s): Pecola Leisure, PA-C  Anesthesia: General  Complications: None  EBL: 125 mL  IVF:  2000 mL crystalloid  Specimens: 1. Right ureteropelvic junction  Disposition of specimens: Pathology  Intraoperative findings: Retrograde pyelography of the right ureter and renal pelvis indicated a normal caliber ureter without filling defects.  There appeared to be a high insertion of the ureter into an anteriorly malrotated kidney and renal pelvis with dilation of the renal pelvis and moderate calyceal dilation.  Drains:  1. # 15 Blake perinephric drain 2. 16 Fr Foley catheter  Indication: Steve Colon is a 17 y.o. patient with a suspected right ureteropelvic junction obstruction.  After a thorough review of the management options for their ureteropelvic junction obstruction, they elected to proceed with surgical treatment and the above procedure. The patient's pain symptoms prior to surgery were out of proportion to what would be expected with a UPJ obstruction and this was discussed with the patient and his family.  It was made clear that he may have another source for his pain beside the UPJ obstruction.  He expressed his understanding of this possibility. We have discussed the potential benefits and risks of the procedure, side effects of the proposed treatment, the likelihood of the patient achieving the goals of the procedure, and any potential problems that might occur during the procedure or recuperation. Informed consent has been obtained.  Description  of procedure:  The patient was taken to the operating room and a general anesthetic was administered. The patient was given preoperative antibiotics, placed in the dorsal lithotomy position, and prepped and draped in the usual sterile fashion. Next a preoperative timeout was performed.  Cystourethroscopy was performed.  The patient's urethra was examined and was normal. The bladder was then systematically examined in its entirety. There was no evidence for any bladder tumors, stones, or other mucosal pathology.    Attention then turned to the right ureteral orifice and a ureteral catheter was used to intubate the ureteral orifice.  Omnipaque contrast was injected through the ureteral catheter and a retrograde pyelogram was performed with findings as dictated above.  A 0.38 sensor guidewire was then advanced up the right ureter into the renal pelvis under fluoroscopic guidance.  The wire was then backloaded through the cystoscope and a ureteral stent was advance over the wire using Seldinger technique.  The stent was positioned appropriately under fluoroscopic and cystoscopic guidance.  The wire was then removed with an adequate stent curl noted in the renal pelvis as well as in the bladder. The bladder was then emptied and a 16 Fr Foley catheter was inserted.   The patient was then repositioned in the right modified flank position and prepped and draped in the usual sterile fashion. A site was selected on the right side of the umbilicus for placement of the camera port. This was placed using a standard open Hassan technique which allowed entry into the peritoneal cavity under direct vision and without difficulty. A 12 mm port was placed and a pneumoperitoneum established. The camera was then used to inspect the abdomen and there was no evidence of any intra-abdominal injuries or other abnormalities. The remaining abdominal ports were then  placed. 8 mm robotic ports were placed in the ipsilateral upper  quadrant, lower quadrant, and far lateral abdominal wall. A 12 mm port was placed in the upper midline for laparoscopic assistance. All ports were placed under direct vision without difficulty. The surgical cart was then docked.   Utilizing the cautery scissors, the white line of Toldt was incised allowing the plane between the mesocolon and the anterior layer of Gerota's fascia to be developed and the kidney exposed.  The ureter and gonadal vein were identified inferiorly and the ureter was lifted anteriorly off the psoas muscle.  Dissection proceeded superiorly along the gonadal vein until the main renal hilum and renal pelvis was identified.  The ureteropelvic junction was isolated from the surrounding structures with a combination of sharp and blunt dissection.    The ureter entered the renal pelvis in a higher than normal insertion and the kidney was malrotated anteriorly with the renal pelvis located immediately anterior to the kidney. During the dissection of the posterior aspect of the lower renal pelvis, there was noted to be diffuse parenchymal bleeding from the surface of the kidney where the pelvis had been dissected away from the kidney. This was controlled with surgicel followed by placement of Surgiflo and a 2-0 vicryl vertical mattress suture which was secured with Lapra-Tys.  The ureteropelvic junction was divided allowing exposure of the indwelling stent and the ureteropelvic junction was excised and removed.  The ureter and renal pelvis were then examined. A flexible cystoscope was placed through the assistant port and an attempt was made to endoscopically examine the renal collecting system.  Although the renal pelvis was able to be visualized, the calyces were not able to be well visualized.  Eventually, I abandoned this attempt to remove his two small renal stones since they were in non-obstructing positions and I did not want to possibly hurt the ability to provide a good anastomosis at  the UPJ.  Any excess renal pelvis was excised as needed and the renal pelvis and ureter were then spatulated appropriately. There was a lower pole calyx that entered into the main renal pelvis just at the level where the ureteropelvic junction was located.  4-0 vicryl sutures were first used to bring this lower pole calyx and incorporate it into the main renal pelvis prior to the final anastomosis.  4-0 vicryl sutures were then used to reapproximate the renal pelvis and ureter with interrupted and/or running sutures as indicated.  The anastomosis was performed in a tension-free, watertight fashion with the ureter reconnected to the renal pelvis in a position to provide dependent drainage of the renal collecting system. The ureteral stent was repositioned appropriately prior to securing the final sutures of the ureteropelvic anastomosis.  A # 15 Blake perinephric drain was then brought through the lateral lower abdominal port site and positioned appropriately.  It was secured to the skin with a nylon suture.  The 12 mm port sites were then closed with 0-vicryl sutures placed laparoscopically with the laparoscopic suture passer. All remaining ports were removed under direct vision after hemostasis was confirmed with the pneumoperiotneum let down. All port sites were injected with 0.25% bupivicaine and reapproximated at the skin level with 4-0 monocryl subcuticular sutures. Dermabond was applied to the skin.  The patient appeared to tolerate the procedure well and without complications.  The patient was able to be extubated and transferred to the recovery unit in satisfactory condition.  Moody Bruins MD

## 2013-04-28 NOTE — Progress Notes (Signed)
Patient ID: Steve Colon, male   DOB: Jan 14, 1996, 17 y.o.   MRN: [id number] Post-op note  Subjective: The patient is doing well.  No complaints except soreness.  Denies N/V.  Objective: Vital signs in last 24 hours: Temp:  [98.1 F (36.7 C)-98.7 F (37.1 C)] 98.7 F (37.1 C) (11/10 1311) Pulse Rate:  [69-133] 69 (11/10 1311) Resp:  [6-18] 18 (11/10 1311) BP: (107-136)/(60-79) 116/61 mmHg (11/10 1311) SpO2:  [99 %-100 %] 99 % (11/10 1311)  Intake/Output from previous day:   Intake/Output this shift: Total I/O In: 2800 [I.V.:2700; IV Piggyback:100] Out: 830 [Urine:675; Drains:30; Blood:125]  Physical Exam:  General: Alert and oriented. Abdomen: Soft, Nondistended. Incisions: Clean and dry.  Lab Results:  Recent Labs  04/28/13 1146  HGB 14.9  HCT 42.5    Assessment/Plan: POD#0   1) Continue to monitor 2) DVT prophy, amb, IS, pain control, clears   LOS: 0 days   YARBROUGH,Gabryela Kimbrell G. 04/28/2013, 4:46 PM

## 2013-04-28 NOTE — Progress Notes (Signed)
Hgb. And Hct. And B Met drawn by lab. 

## 2013-04-29 ENCOUNTER — Encounter (HOSPITAL_COMMUNITY): Payer: Self-pay | Admitting: Urology

## 2013-04-29 LAB — BASIC METABOLIC PANEL
CO2: 26 mEq/L (ref 19–32)
Calcium: 8.9 mg/dL (ref 8.4–10.5)
Chloride: 102 mEq/L (ref 96–112)
Creatinine, Ser: 0.96 mg/dL (ref 0.50–1.35)
GFR calc Af Amer: >90 mL/min (ref >90)
Sodium: 135 mEq/L (ref 135–145)

## 2013-04-29 LAB — CREATININE, FLUID (PLEURAL, PERITONEAL, JP DRAINAGE)

## 2013-04-29 LAB — HEMOGLOBIN AND HEMATOCRIT, BLOOD
HCT: 35.7 % — ABNORMAL LOW (ref 39.0–52.0)
Hemoglobin: 12.6 g/dL — ABNORMAL LOW (ref 13.0–17.0)

## 2013-04-29 MED ORDER — HYDROMORPHONE HCL PF 1 MG/ML IJ SOLN
0.5000 mg | INTRAMUSCULAR | Status: DC | PRN
  Administered 2013-04-29 – 2013-04-30 (×5): 1 mg via INTRAVENOUS
  Filled 2013-04-29 (×5): qty 1

## 2013-04-29 MED ORDER — DSS 100 MG PO CAPS: 100.0000 mg | ORAL_CAPSULE | Freq: Two times a day (BID) | ORAL | Status: DC

## 2013-04-29 MED ORDER — HYDROMORPHONE HCL 2 MG PO TABS: 2.0000 mg | ORAL_TABLET | ORAL | Status: DC | PRN

## 2013-04-29 MED ORDER — HYDROMORPHONE HCL 2 MG PO TABS
2.0000 mg | ORAL_TABLET | ORAL | Status: DC | PRN
  Administered 2013-04-29 – 2013-04-30 (×4): 2 mg via ORAL
  Filled 2013-04-29 (×4): qty 1

## 2013-04-29 NOTE — Progress Notes (Addendum)
With IV Dilaudid pain management, pt was able to ambulate 1500 ft, 3 laps around unit, day shift, 04/29/13. Pt reports no pain relief with PO Dilaudid medication. Pt foley D/C'd at 0830. Pt JP drain D/C'd at 1800. 4/4 gauze/tape applied to site. Site is clean/dry/intact.  Langley Gauss, RN

## 2013-04-29 NOTE — Anesthesia Postprocedure Evaluation (Signed)
  Anesthesia Post-op Note  Patient: Steve Colon  Procedure(s) Performed: Procedure(s) (LRB): ROBOTIC ASSISTED PYELOPLASTY/ RIGHT PYELOLITHOTOMY (Right) CYSTOSCOPY WITH RIGHT RETROGRADE PYELOGRAM/RIGHT URETERAL STENT PLACEMENT (Right)  Patient Location: PACU  Anesthesia Type: General  Level of Consciousness: awake and alert   Airway and Oxygen Therapy: Patient Spontanous Breathing  Post-op Pain: mild  Post-op Assessment: Post-op Vital signs reviewed, Patient's Cardiovascular Status Stable, Respiratory Function Stable, Patent Airway and No signs of Nausea or vomiting  Last Vitals:  Filed Vitals:   04/29/13 2024  BP: 103/61  Pulse: 83  Temp: 37.2 C  Resp: 24    Post-op Vital Signs: stable   Complications: No apparent anesthesia complications

## 2013-04-29 NOTE — Progress Notes (Signed)
Patient ID: Steve Colon, male   DOB: [birth year], 17 y.o.   MRN: [id number]  1 Day Post-Op Subjective: Pt complains of incisional pain.  Tolerating clear liquids.  No nausea or vomiting.  Objective: Vital signs in last 24 hours: Temp:  [97.6 F (36.4 C)-98.7 F (37.1 C)] 97.6 F (36.4 C) (11/11 0500) Pulse Rate:  [55-133] 55 (11/11 0500) Resp:  [6-20] 20 (11/11 0500) BP: (104-136)/(56-79) 114/57 mmHg (11/11 0500) SpO2:  [98 %-100 %] 100 % (11/11 0500) Weight:  [61.5 kg (135 lb 9.3 oz)] 61.5 kg (135 lb 9.3 oz) (11/10 1400)  Intake/Output from previous day: 11/10 0701 - 11/11 0700 In: 6000 [P.O.:480; I.V.:5120; IV Piggyback:400] Out: 3982 [Urine:3800; Drains:57; Blood:125] Intake/Output this shift:    Physical Exam:  General: Alert and oriented CV: RRR Lungs: Clear Abdomen: Soft, ND, Positive BS Incisions: C/D/I Ext: NT, No erythema  Lab Results:  Recent Labs  04/28/13 1146 04/29/13 0410  HGB 14.9 12.6*  HCT 42.5 35.7*   BMET  Recent Labs  04/28/13 1146 04/29/13 0410  NA 138 135  K 3.7 4.0  CL 103 102  CO2 24 26  GLUCOSE 115* 161*  BUN 10 8  CREATININE 0.99 0.96  CALCIUM 9.5 8.9     Studies/Results: Dg Abd Portable 1v  04/28/2013   CLINICAL DATA:  Postop stent placement  EXAM: PORTABLE ABDOMEN - 1 VIEW  COMPARISON:  CT, 03/03/2013  FINDINGS: There is a double-J ureteral stent on the right. Another catheter loops over the superior margin of the stent to have its tip lie in the medial to the lower pole of the right kidney, adjacent to a calculus that projects along the medial lower pole.  Normal bowel gas pattern. The soft tissues are otherwise unremarkable. No significant bony abnormality.  IMPRESSION: Right double-J ureteral stent appears well positioned. Loops over the superior aspect of the right ureteral stent as detailed above.  No evidence of a PROCEDURE complication.   Electronically Signed   By: Amie Portland M.D.   On: 04/28/2013 12:46   Dg C-arm  1-60 Min-no Report  04/28/2013   CLINICAL DATA: retrograde ofr stent placement   C-ARM 1-60 MINUTES  Fluoroscopy was utilized by the requesting physician.  No radiographic  interpretation.     Assessment/Plan: POD # 1 s/p robotic pyeloplasty - Dulcolax suppository - Advance diet - Transition to po pain meds - D/C Foley - Check drain Cr level - Will assess for d/c later today but expect pain control to be an issue considering pre-operative pain symptoms and poor pain tolerance.  May be more likely ready for discharge tomorrow.   LOS: 1 day   Brevin Mcfadden,LES 04/29/2013, 7:42 AM

## 2013-04-29 NOTE — Progress Notes (Signed)
Patient ID: Steve Colon, male   DOB: 05-23-1996, 17 y.o.   MRN: [id number] Post-op note  Subjective: The patient is doing well. Sleeping upon entering room.  Still with c/o soreness. States PO Dilaudid did little to relieve pain this morning and RN gave IV Dilaudid after.  Even with IV pain meds pt still states he has pain.  Denies N/V.  Has passed flatus.  No BM.  Has only ambulated once.    JP fluid has not been sent for Cr.   Objective: Vital signs in last 24 hours: Temp:  [97.6 F (36.4 C)-98.4 F (36.9 C)] 97.6 F (36.4 C) (11/11 0500) Pulse Rate:  [55-74] 55 (11/11 0500) Resp:  [12-20] 20 (11/11 0500) BP: (104-119)/(56-71) 114/57 mmHg (11/11 0500) SpO2:  [98 %-100 %] 100 % (11/11 0500)  Intake/Output from previous day: 11/10 0701 - 11/11 0700 In: 6000 [P.O.:480; I.V.:5120; IV Piggyback:400] Out: 3982 [Urine:3800; Drains:57; Blood:125] Intake/Output this shift: Total I/O In: 600 [P.O.:600] Out: 600 [Urine:600]  Physical Exam:  General: Alert and oriented. Abdomen: Soft, Nondistended. Incisions: Clean and dry.  Lab Results:  Recent Labs  04/28/13 1146 04/29/13 0410  HGB 14.9 12.6*  HCT 42.5 35.7*    Assessment/Plan: POD#0   1) Continue to monitor 2) Advised pt to avoid IV pain meds as much as possible 3) Spoke with RN about JP Cr and ambulation.  States he will attend to these issues. 4) Poss d/c tomorrow   LOS: 1 day   YARBROUGH,Jeny Nield G. 04/29/2013, 2:11 PM

## 2013-04-29 NOTE — Progress Notes (Signed)
Utilization review completed.  

## 2013-04-30 MED ORDER — OXYCODONE HCL 5 MG PO TABS: 5.0000 mg | ORAL_TABLET | ORAL | Status: DC | PRN

## 2013-04-30 MED ORDER — HYDROMORPHONE HCL 2 MG PO TABS
2.0000 mg | ORAL_TABLET | ORAL | Status: DC | PRN
  Administered 2013-04-30 (×3): 2 mg via ORAL
  Filled 2013-04-30 (×3): qty 1

## 2013-04-30 MED ORDER — BISACODYL 10 MG RE SUPP
10.0000 mg | Freq: Every day | RECTAL | Status: DC | PRN
  Administered 2013-04-30: 13:00:00 10 mg via RECTAL
  Filled 2013-04-30 (×2): qty 1

## 2013-04-30 NOTE — Progress Notes (Signed)
Attempts to encourage pt to take PO pain medication fail.  Pt is VERY resistant to PO pain medication and demands IV dilaudid as frequently as he can get it.    Pt has walked x3 today.

## 2013-04-30 NOTE — Progress Notes (Addendum)
Patient ID: Steve Colon, male   DOB: 10-Mar-1996, 17 y.o.   MRN: [id number]  2 Days Post-Op Subjective: Pt was able to ambulate adequately yesterday.  No nausea or vomiting.  Tolerating diet. Passed flatus.  Still complains of severe pain and has been using IV pain medication as frequently as allowed.  Objective: Vital signs in last 24 hours: Temp:  [97.5 F (36.4 C)-98.9 F (37.2 C)] 98.9 F (37.2 C) (11/12 0502) Pulse Rate:  [62-99] 99 (11/12 0502) Resp:  [18-24] 24 (11/12 0502) BP: (103-120)/(60-61) 112/61 mmHg (11/12 0502) SpO2:  [98 %-100 %] 98 % (11/12 0502)  Intake/Output from previous day: 11/11 0701 - 11/12 0700 In: 1680 [P.O.:1680] Out: 2720 [Urine:2700; Drains:20] Intake/Output this shift: Total I/O In: 720 [P.O.:720] Out: 1100 [Urine:1100]  Physical Exam:  General: Alert and oriented CV: RRR Lungs: Clear Abdomen: Soft, ND, Positive BS Incisions: C/D/I Ext: NT, No erythema  Lab Results: BMET  Recent Labs  04/28/13 1146 04/29/13 0410  NA 138 135  K 3.7 4.0  CL 103 102  CO2 24 26  GLUCOSE 115* 161*  BUN 10 8  CREATININE 0.99 0.96  CALCIUM 9.5 8.9    Assessment/Plan: POD # 2 s/p right pyeloplasty - Will adjust po pain medication.  It is very difficult to assess patient.  He required much higher than normal amounts of pain medication even prior to surgery indicating either very poor pain tolerance, another non-GU source for his pain, or pain seeking behavior.  Will attempt to change to po oxycodone today after discussion with him.  If pain is still not controlled, will consider CT scan of abdomen and pelvis with oral contrast to further evaluate for any other possible source of pain. If pain is controlled, he can be d/c'd home today.   Patient had initially indicated that he had taken Percocet without trouble in the past.  On further discussion, he states he has not had Percocet previously.  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   LOS: 2 days   Natasja Niday,LES 04/30/2013, 6:27 AM

## 2013-05-01 NOTE — Discharge Summary (Signed)
Date of admission: 04/28/2013  Date of discharge: 04/30/13  Admission diagnosis: Right UPJ obstruction  Discharge diagnosis: same  Secondary diagnoses: anxiety  History and Physical: For full details, please see admission history and physical. Briefly, Steve Colon is a 17 y.o. year old patient who has a long standing history of intermitent right flank pain. He states that this has been going on for the last 3 or 4 years intermittently and occur approximately twice per month. He has not noted any associated risk factors for his pain or any exacerbating issues. He also notes no factors that will improve his symptoms once they develop. Over the past few months, his pain became more severe and occurred more frequently. 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 He was taken to the OR by Dr. Wendall Stade who performed right retrograde pyelography demonstrating a normal caliber right ureter with a transition point at the right UPJ with right hydronephrosis. A ureteral stent was placed. He had some initial relief of his pain but has since continued to have pain likely related to his stent. He states that his pain at this point is pretty similar to how was prior to his stent being placed and then have lower urinary tract symptoms related to the stent including urinary frequency and dysuria. He currently has been taking Dilaudid for pain control. His CT scan also incidentally demonstrated two renal calculi in the right kidney measuring 2 and 4 mm.  He has denied any hematuria, history of urinary tract infections, or symptomatic episodes of urolithiasis. He has no history of renal dysfunction.    Hospital Course: Pt was admitted and taken to the OR on 04/28/13 for cystoscopy, right retrograde  pyelogram, right ureteral stent placement, right robotic assisted laparoscopic dismembered pyeloplasty, and right ureterorenoscopy.  Pt tolerated the procedure well and was hemodynamically stable immediately post op.  He was extubated without complication and woke up from anesthesia neurologically intact. His post op course progressed as expected with the exception of abnormal pain levels. It was very difficult to assess the patient's pain. He required much higher than normal amounts of pain medication even prior to surgery indicating either very poor pain tolerance, another non-GU source for his pain, or pain seeking behavior. 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  He was able to ambulate, tolerate a regular diet, and void without difficulty.  He was passing flatus but had not had a BM.  JP fluid Cr value was consistent with serum and the drain was removed without difficulty.  He was felt to be stable for d/c home on POD 2.      Laboratory values:  Recent Labs  04/28/13 1146 04/29/13 0410  HGB 14.9 12.6*  HCT 42.5 35.7*    Recent Labs  04/28/13 1146 04/29/13 0410  CREATININE 0.99 0.96    Disposition: Home  Discharge instruction: The patient was instructed to be ambulatory but told to refrain from heavy lifting, strenuous activity, or driving.   Discharge medications:    Medication List    STOP taking these medications       tamsulosin 0.4 MG Caps capsule  Commonly known as:  FLOMAX      TAKE these medications  DSS 100 MG Caps  Take 100 mg by mouth 2 (two) times daily.     HYDROmorphone 2 MG tablet  Commonly known as:  DILAUDID  Take 1 tablet (2 mg total) by mouth every 3 (three) hours as needed.        Followup:      Follow-up Information   Follow up with Crecencio Mc, MD On 06/03/2013. (at 10:15)    Specialty:  Urology   Contact information:   [address]  [place] [address] [phone]

## 2013-05-02 ENCOUNTER — Encounter (HOSPITAL_COMMUNITY): Payer: Self-pay | Admitting: Emergency Medicine

## 2013-05-02 ENCOUNTER — Emergency Department (HOSPITAL_COMMUNITY): Payer: BC Managed Care – PPO

## 2013-05-02 ENCOUNTER — Inpatient Hospital Stay (HOSPITAL_COMMUNITY)
Admission: EM | Admit: 2013-05-02 | Discharge: 2013-05-05 | DRG: 700 | Disposition: A | Discharge status: Home / Self Care | Payer: BC Managed Care – PPO | Attending: Urology | Admitting: Urology

## 2013-05-02 DIAGNOSIS — N135 Crossing vessel and stricture of ureter without hydronephrosis: Secondary | ICD-10-CM | POA: Diagnosis present

## 2013-05-02 DIAGNOSIS — IMO0002 Reserved for concepts with insufficient information to code with codable children: Principal | ICD-10-CM | POA: Diagnosis present

## 2013-05-02 DIAGNOSIS — F17200 Nicotine dependence, unspecified, uncomplicated: Secondary | ICD-10-CM | POA: Diagnosis present

## 2013-05-02 DIAGNOSIS — Y838 Other surgical procedures as the cause of abnormal reaction of the patient, or of later complication, without mention of misadventure at the time of the procedure: Secondary | ICD-10-CM | POA: Diagnosis present

## 2013-05-02 DIAGNOSIS — Z87442 Personal history of urinary calculi: Secondary | ICD-10-CM

## 2013-05-02 DIAGNOSIS — N9989 Other postprocedural complications and disorders of genitourinary system: Secondary | ICD-10-CM | POA: Diagnosis present

## 2013-05-02 DIAGNOSIS — B999 Unspecified infectious disease: Secondary | ICD-10-CM

## 2013-05-02 DIAGNOSIS — Z79899 Other long term (current) drug therapy: Secondary | ICD-10-CM

## 2013-05-02 DIAGNOSIS — R5082 Postprocedural fever: Secondary | ICD-10-CM | POA: Diagnosis present

## 2013-05-02 DIAGNOSIS — Z87718 Personal history of other specified (corrected) congenital malformations of genitourinary system: Secondary | ICD-10-CM

## 2013-05-02 LAB — CBC WITH DIFFERENTIAL/PLATELET
Basophils Absolute: 0 10*3/uL (ref 0.0–0.1)
Basophils Relative: 0 % (ref 0–1)
Eosinophils Relative: 0 % (ref 0–5)
HCT: 41.1 % (ref 39.0–52.0)
Hemoglobin: 14.8 g/dL (ref 13.0–17.0)
Lymphocytes Relative: 8 % — ABNORMAL LOW (ref 12–46)
MCH: 31.9 pg (ref 26.0–34.0)
MCHC: 36 g/dL (ref 30.0–36.0)
MCV: 88.6 fL (ref 78.0–100.0)
Monocytes Absolute: 1.7 10*3/uL — ABNORMAL HIGH (ref 0.1–1.0)
Monocytes Relative: 16 % — ABNORMAL HIGH (ref 3–12)
RDW: 11.6 % (ref 11.5–15.5)

## 2013-05-02 LAB — BASIC METABOLIC PANEL
BUN: 14 mg/dL (ref 6–23)
CO2: 28 mEq/L (ref 19–32)
Chloride: 92 mEq/L — ABNORMAL LOW (ref 96–112)
Creatinine, Ser: 0.84 mg/dL (ref 0.50–1.35)
Glucose, Bld: 115 mg/dL — ABNORMAL HIGH (ref 70–99)

## 2013-05-02 LAB — URINALYSIS, ROUTINE W REFLEX MICROSCOPIC
Bilirubin Urine: NEGATIVE
Glucose, UA: 100 mg/dL — AB
Ketones, ur: NEGATIVE mg/dL
Leukocytes, UA: NEGATIVE
Specific Gravity, Urine: >1.03 — ABNORMAL HIGH (ref 1.005–1.030)
pH: 6.5 (ref 5.0–8.0)

## 2013-05-02 LAB — URINE MICROSCOPIC-ADD ON

## 2013-05-02 MED ORDER — HYDROMORPHONE HCL PF 1 MG/ML IJ SOLN
1.0000 mg | Freq: Once | INTRAMUSCULAR | Status: AC
  Administered 2013-05-02: 1 mg via INTRAVENOUS
  Filled 2013-05-02: qty 1

## 2013-05-02 MED ORDER — DEXTROSE 5 % IV SOLN
1.0000 g | Freq: Once | INTRAVENOUS | Status: AC
  Administered 2013-05-02: 1 g via INTRAVENOUS
  Filled 2013-05-02: qty 10

## 2013-05-02 MED ORDER — ONDANSETRON HCL 4 MG/2ML IJ SOLN
4.0000 mg | Freq: Once | INTRAMUSCULAR | Status: AC
  Administered 2013-05-02: 4 mg via INTRAVENOUS
  Filled 2013-05-02: qty 2

## 2013-05-02 NOTE — ED Notes (Signed)
MD at bedside. 

## 2013-05-02 NOTE — ED Provider Notes (Signed)
CSN: [id number]     Arrival date & time 05/02/13  1859 History  This chart was scribed for Sunnie Nielsen, MD by Danella Maiers, ED Scribe. This patient was seen in room APA14/APA14 and the patient's care was started at 11:07 PM.   Chief Complaint  Patient presents with  . Fever   Patient is a 17 y.o. male presenting with fever. The history is provided by the patient. No language interpreter was used.  Fever Max temp prior to arrival:  102 Temp source:  Oral Severity:  Moderate Onset quality:  Gradual Duration:  1 day Associated symptoms: dysuria, nausea and sore throat   Associated symptoms: no vomiting    HPI Comments: Steve Colon is a 17 y.o. male who presents to the Emergency Department complaining of worsening fever since yesterday with sore throat. He had a fever of 99.5 yesterday but tonight it was 102, which he measured at home. He had surgery on right kidney at [facility] four days ago for obstruction of kidney, stent in place. He called his urologist Dr Retta Diones who told him to come to the ER. He reports severe dysuria. He reports nausea but denies vomiting.   he's had persistent abdominal pain since surgery, sharp and severe mostly right-sided.  Past Medical History  Diagnosis Date  . Kidney stones    Past Surgical History  Procedure Laterality Date  . Eye surgery  4 yrs ago    to straighte eyes  . Robot assisted pyeloplasty Right 04/28/2013    Procedure: ROBOTIC ASSISTED PYELOPLASTY/ RIGHT PYELOLITHOTOMY;  Surgeon: Crecencio Mc, MD;  Location: [facility] ORS;  Service: Urology;  Laterality: Right;  . Cystoscopy w/ ureteral stent placement Right 04/28/2013    Procedure: CYSTOSCOPY WITH RIGHT RETROGRADE PYELOGRAM/RIGHT URETERAL STENT PLACEMENT;  Surgeon: Crecencio Mc, MD;  Location: [facility] ORS;  Service: Urology;  Laterality: Right;   History reviewed. No pertinent family history. History  Substance Use Topics  . Smoking status: Current Every Day Smoker -- 0.50 packs/day for 2  years    Types: Cigarettes  . Smokeless tobacco: Never Used  . Alcohol Use: No    Review of Systems  Constitutional: Positive for fever.  HENT: Positive for sore throat.   Gastrointestinal: Positive for nausea. Negative for vomiting.  Genitourinary: Positive for dysuria.  All other systems reviewed and are negative.    Allergies  Codeine; Toradol; Tramadol; and Barium-containing compounds  Home Medications   Current Outpatient Rx  Name  Route  Sig  Dispense  Refill  . docusate sodium 100 MG CAPS   Oral   Take 100 mg by mouth 2 (two) times daily.   10 capsule   0   . HYDROmorphone (DILAUDID) 2 MG tablet   Oral   Take 1 tablet (2 mg total) by mouth every 3 (three) hours as needed.   50 tablet   0   . Sennosides (EX-LAX) 15 MG TABS   Oral   Take 1 tablet by mouth 2 (two) times daily.          BP 121/76  Pulse 85  Temp(Src) 98.6 F (37 C) (Oral)  Resp 18  Ht 5\' 5"  (1.651 m)  Wt 135 lb (61.236 kg)  BMI 22.47 kg/m2  SpO2 96% Physical Exam  Nursing note and vitals reviewed. Constitutional: He is oriented to person, place, and time. He appears well-developed and well-nourished. No distress.  HENT:  Head: Normocephalic and atraumatic.  Eyes: EOM are normal.  Neck: Neck supple. No tracheal  deviation present.  Cardiovascular: Normal rate, regular rhythm and normal heart sounds.   Pulmonary/Chest: Effort normal and breath sounds normal. No respiratory distress. He has no wheezes. He has no rales.  Abdominal:  tender diffusely worse in the left, surgical scars appear to be well healing.  Musculoskeletal: Normal range of motion.  Neurological: He is alert and oriented to person, place, and time.  Skin: Skin is warm and dry.  Psychiatric: He has a normal mood and affect. His behavior is normal.    ED Course  Procedures (including critical care time) Medications - No data to display  DIAGNOSTIC STUDIES: Oxygen Saturation is 96% on RA, normal by my  interpretation.    COORDINATION OF CARE: 11:37 PM- Discussed treatment plan with pt. Pt agrees to plan.    Labs Review Labs Reviewed  URINALYSIS, ROUTINE W REFLEX MICROSCOPIC - Abnormal; Notable for the following:    Specific Gravity, Urine >1.030 (*)    Glucose, UA 100 (*)    Hgb urine dipstick LARGE (*)    Protein, ur 100 (*)    All other components within normal limits  BASIC METABOLIC PANEL - Abnormal; Notable for the following:    Sodium 131 (*)    Chloride 92 (*)    Glucose, Bld 115 (*)    All other components within normal limits  CBC WITH DIFFERENTIAL - Abnormal; Notable for the following:    WBC 10.6 (*)    Platelets 149 (*)    Neutro Abs 8.0 (*)    Lymphocytes Relative 8 (*)    Monocytes Relative 16 (*)    Monocytes Absolute 1.7 (*)    All other components within normal limits  URINE MICROSCOPIC-ADD ON - Abnormal; Notable for the following:    Squamous Epithelial / LPF FEW (*)    Bacteria, UA FEW (*)    All other components within normal limits  URINE CULTURE   Imaging Review Dg Chest 2 View  05/02/2013   CLINICAL DATA:  Cough, congestion, and fever for 4 days since kidney surgery last Monday. History of smoking.  EXAM: CHEST  2 VIEW  COMPARISON:  Chest x-ray 01/07/2013, abdominal CT on 03/03/2013  FINDINGS: Heart size is normal. The lungs are free of focal consolidations and pleural effusions. There Mild bronchitic changes. Note is made of a small amount of free intraperitoneal air, possibly related to recent surgery.  IMPRESSION: 1. Well bronchitic changes. 2.  No focal pulmonary abnormality. 3. Small amount of free intraperitoneal air, possibly postoperative.   Electronically Signed   By: Rosalie Gums M.D.   On: 05/02/2013 19:46   Ct Abdomen Pelvis W Contrast  05/03/2013   CLINICAL DATA:  Abdominal pain and fever. Shortness of breath. Recent right renal surgery.  EXAM: CT ABDOMEN AND PELVIS WITH CONTRAST  TECHNIQUE: Multidetector CT imaging of the abdomen and  pelvis was performed using the standard protocol following bolus administration of intravenous contrast.  CONTRAST:  50mL OMNIPAQUE IOHEXOL 300 MG/ML SOLN, OMNIPAQUE IOHEXOL 300 MG/ML SOLN  COMPARISON:  CT of the abdomen and pelvis performed 03/03/2013, and renal ultrasound performed 03/17/2013  FINDINGS: The visualized lung bases are clear.  Scattered tiny foci of free air within the abdomen are likely postoperative in nature. Associated soft tissue air is seen tracking along the right anterolateral abdominal wall.  The liver and spleen are unremarkable in appearance. The gallbladder is within normal limits. The pancreas and adrenal glands are unremarkable in appearance.  The patient is status post recent  right-sided pyeloplasty and calyceal-renal pelvis anastomoses. There is a very large right-sided phlegmon, measuring approximately 10.0 x 9.5 x 4.8 cm. This encompasses the anterior aspect of the right kidney. Areas of fluid are noted throughout the phlegmon, particularly along the course of the right ureter; this demonstrates peripheral enhancement, raising concern for evolving abscess. Mildly decreased enhancement at the lower pole of the right kidney raises concern for underlying pyelonephritis.  The right ureteral stent is noted in expected position; mild soft tissue inflammation tracks along the course of the stent. A small amount of free fluid is noted within the pelvis, tracking inferiorly from the phlegmon at the right abdomen.  The left kidney is unremarkable. A small left-sided extrarenal pelvis is noted.  The small bowel is unremarkable in appearance. The stomach is within normal limits. No acute vascular abnormalities are seen.  The appendix remains normal in caliber, without evidence for appendicitis. There is diffuse wall thickening involving the ascending colon at the site of the renal process, and the phlegmon described above involves a portion of the ascending colon. The remainder of the  colon is partially filled with stool and is unremarkable in appearance.  The bladder is mildly distended and otherwise grossly unremarkable. The prostate remains normal in size. No inguinal lymphadenopathy is seen.  No acute osseous abnormalities are identified.  IMPRESSION: 1. Very large right-sided phlegmon, measuring 10.0 x 9.5 x 4.8 cm, encompassing the anterior aspect of the right kidney. Areas of fluid seen throughout the phlegmon, particularly along the course of the right ureter, demonstrating peripheral enhancement. This raises concern for underlying evolving abscess. Mildly decreased enhancement at the lower pole of the right kidney raises concern for underlying pyelonephritis. 2. Right ureteral stent remains in expected position. Small amount of free fluid within the pelvis tracks inferiorly from the phlegmon at the right mid abdomen. 3. Diffuse wall thickening involving the ascending colon at the site of the renal process; the phlegmon includes a portion of the ascending colon. 4. Postoperative tiny foci of free air within the abdomen, within normal limits; scattered postoperative soft tissue air along the right lateral abdominal wall.  These results were called by telephone at the time of interpretation on 05/03/2013 at 2:07 AM to Dr. Sunnie Nielsen , who verbally acknowledged these results.   Electronically Signed   By: Roanna Raider M.D.   On: 05/03/2013 02:13    IV fluids. IV Rocephin. IV Dilaudid. IV Zofran  CT scan discussed with radiologist and urology consulted  2:21 AM plan admit Urology at [facility] MDM  Dx: Post surgical infection  Labs, UA, Imaging as above IVFs, IV narcotics Transfer/ admit [facility]   I personally performed the services described in this documentation, which was scribed in my presence. The recorded information has been reviewed and is accurate.      Sunnie Nielsen, MD 05/03/13 [phone]

## 2013-05-02 NOTE — ED Notes (Signed)
Had surgery on rt kidney on Monday for blockage of kidney, Stent in place.  Fever 102 at home. And feels sob.  Sore throat.

## 2013-05-03 ENCOUNTER — Inpatient Hospital Stay (HOSPITAL_COMMUNITY): Payer: BC Managed Care – PPO

## 2013-05-03 ENCOUNTER — Emergency Department (HOSPITAL_COMMUNITY): Payer: BC Managed Care – PPO

## 2013-05-03 ENCOUNTER — Encounter (HOSPITAL_COMMUNITY): Payer: Self-pay | Admitting: Oncology

## 2013-05-03 LAB — CBC
Hemoglobin: 13.4 g/dL (ref 13.0–17.0)
MCH: 31.7 pg (ref 26.0–34.0)
MCV: 87.5 fL (ref 78.0–100.0)
Platelets: 140 10*3/uL — ABNORMAL LOW (ref 150–400)
RBC: 4.23 MIL/uL (ref 4.22–5.81)
RDW: 11.6 % (ref 11.5–15.5)

## 2013-05-03 MED ORDER — DIPHENHYDRAMINE HCL 12.5 MG/5ML PO ELIX: 12.5000 mg | ORAL_SOLUTION | Freq: Four times a day (QID) | ORAL | Status: DC | PRN

## 2013-05-03 MED ORDER — DOCUSATE SODIUM 100 MG PO CAPS
100.0000 mg | ORAL_CAPSULE | Freq: Two times a day (BID) | ORAL | Status: DC
  Administered 2013-05-03 – 2013-05-04 (×4): 100 mg via ORAL
  Filled 2013-05-03 (×6): qty 1

## 2013-05-03 MED ORDER — DEXTROSE 5 % IV SOLN
1.0000 g | INTRAVENOUS | Status: DC
  Administered 2013-05-03 – 2013-05-04 (×2): 1 g via INTRAVENOUS
  Filled 2013-05-03 (×2): qty 10

## 2013-05-03 MED ORDER — ONDANSETRON HCL 4 MG/2ML IJ SOLN
4.0000 mg | Freq: Once | INTRAMUSCULAR | Status: AC
  Administered 2013-05-03: 4 mg via INTRAVENOUS
  Filled 2013-05-03: qty 2

## 2013-05-03 MED ORDER — INFLUENZA VAC SPLIT QUAD 0.5 ML IM SUSP
0.5000 mL | INTRAMUSCULAR | Status: DC
  Filled 2013-05-03 (×2): qty 0.5

## 2013-05-03 MED ORDER — HYDROMORPHONE HCL PF 1 MG/ML IJ SOLN
1.0000 mg | INTRAMUSCULAR | Status: DC | PRN
  Administered 2013-05-03: 1 mg via INTRAVENOUS
  Filled 2013-05-03: qty 1

## 2013-05-03 MED ORDER — SODIUM CHLORIDE 0.9 % IV SOLN
INTRAVENOUS | Status: DC
  Administered 2013-05-03: 06:00:00 1 mL via INTRAVENOUS
  Administered 2013-05-04 (×2): via INTRAVENOUS

## 2013-05-03 MED ORDER — HYDROMORPHONE 0.3 MG/ML IV SOLN
INTRAVENOUS | Status: DC
  Administered 2013-05-03: 1.6 mg via INTRAVENOUS
  Administered 2013-05-03: 3.2 mg via INTRAVENOUS
  Administered 2013-05-03: 0.5 mg via INTRAVENOUS
  Administered 2013-05-03: 15:00:00 0.3 mg via INTRAVENOUS
  Administered 2013-05-03: 6 mg via INTRAVENOUS
  Administered 2013-05-04: 5.1 mg via INTRAVENOUS
  Administered 2013-05-04: 5.7 mg via INTRAVENOUS
  Administered 2013-05-04: 20:00:00 via INTRAVENOUS
  Administered 2013-05-04: 0.9 mL via INTRAVENOUS
  Administered 2013-05-04: 11:00:00 via INTRAVENOUS
  Administered 2013-05-04: 25 mg via INTRAVENOUS
  Administered 2013-05-04: 3.9 mg via INTRAVENOUS
  Filled 2013-05-03 (×5): qty 25

## 2013-05-03 MED ORDER — IOHEXOL 300 MG/ML  SOLN
50.0000 mL | Freq: Once | INTRAMUSCULAR | Status: AC | PRN
  Administered 2013-05-03: 50 mL via ORAL

## 2013-05-03 MED ORDER — NALOXONE HCL 0.4 MG/ML IJ SOLN: 0.4000 mg | INTRAMUSCULAR | Status: DC | PRN

## 2013-05-03 MED ORDER — ACETAMINOPHEN 325 MG PO TABS: 650.0000 mg | ORAL_TABLET | ORAL | Status: DC | PRN

## 2013-05-03 MED ORDER — SODIUM CHLORIDE 0.9 % IJ SOLN: 9.0000 mL | INTRAMUSCULAR | Status: DC | PRN

## 2013-05-03 MED ORDER — IOHEXOL 300 MG/ML  SOLN
100.0000 mL | Freq: Once | INTRAMUSCULAR | Status: AC | PRN
  Administered 2013-05-03: 100 mL via INTRAVENOUS

## 2013-05-03 MED ORDER — DIPHENHYDRAMINE HCL 50 MG/ML IJ SOLN
12.5000 mg | Freq: Four times a day (QID) | INTRAMUSCULAR | Status: DC | PRN
  Administered 2013-05-03 – 2013-05-05 (×7): 12.5 mg via INTRAVENOUS
  Filled 2013-05-03 (×7): qty 1

## 2013-05-03 MED ORDER — ONDANSETRON HCL 4 MG/2ML IJ SOLN
4.0000 mg | Freq: Four times a day (QID) | INTRAMUSCULAR | Status: DC | PRN
  Administered 2013-05-03: 4 mg via INTRAVENOUS
  Filled 2013-05-03 (×2): qty 2

## 2013-05-03 MED ORDER — ONDANSETRON HCL 4 MG/2ML IJ SOLN
4.0000 mg | Freq: Three times a day (TID) | INTRAMUSCULAR | Status: DC | PRN
  Administered 2013-05-03: 4 mg via INTRAVENOUS
  Filled 2013-05-03: qty 2

## 2013-05-03 MED ORDER — SODIUM CHLORIDE 0.9 % IV SOLN
INTRAVENOUS | Status: AC
  Administered 2013-05-03: 03:00:00 via INTRAVENOUS

## 2013-05-03 NOTE — ED Notes (Signed)
Pt reporting increase in nausea while drinking oral contrast. EDP made aware.

## 2013-05-03 NOTE — ED Notes (Signed)
Report given to Justin with [facility] 

## 2013-05-03 NOTE — H&P (Signed)
Urology History and Physical Exam  CC: Abdominal pain, fever  HPI: 17 year old male s/p RALPyeloplasty 04/28/2013 admitted this morning for increased abdominal pain, fever of 103 w/ CT findings of inflammatory fluid collection anterior to his right kidney.  The patient underwent an uncomplicated surgical procedure earlier this week and was discharged on postoperative day #2. The day after he went home, he had a fever just above 99 and increased abdominal pain. He called me last night from home with a fever of 102.9 and abdominal pain and just not feeling well. He was having no nausea or vomiting. I told him to go to the emergency room for evaluation. He had a CT scan at [facility] which revealed the previous findings. He was then sent to [facility] for further management.  He has had significant stent pain. That was evident before the surgery as well. He has had worsening pain with voiding. He is not have blood in his urine.  PMH: Past Medical History  Diagnosis Date  . Kidney stones     PSH: Past Surgical History  Procedure Laterality Date  . Eye surgery  4 yrs ago    to straighte eyes  . Robot assisted pyeloplasty Right 04/28/2013    Procedure: ROBOTIC ASSISTED PYELOPLASTY/ RIGHT PYELOLITHOTOMY;  Surgeon: Crecencio Mc, MD;  Location: [facility];  Service: Urology;  Laterality: Right;  . Cystoscopy w/ ureteral stent placement Right 04/28/2013    Procedure: CYSTOSCOPY WITH RIGHT RETROGRADE PYELOGRAM/RIGHT URETERAL STENT PLACEMENT;  Surgeon: Crecencio Mc, MD;  Location: [facility];  Service: Urology;  Laterality: Right;    Allergies: Allergies  Allergen Reactions  . Codeine Anaphylaxis and Swelling  . Toradol [Ketorolac Tromethamine] Anaphylaxis and Swelling  . Tramadol Nausea And Vomiting  . Barium-Containing Compounds Rash    Medications: Prescriptions prior to admission  Medication Sig Dispense Refill  . docusate sodium 100 MG CAPS Take 100 mg by mouth 2  (two) times daily.  10 capsule  0  . HYDROmorphone (DILAUDID) 2 MG tablet Take 1 tablet (2 mg total) by mouth every 3 (three) hours as needed.  50 tablet  0  . Sennosides (EX-LAX) 15 MG TABS Take 1 tablet by mouth 2 (two) times daily.         Social History: History   Social History  . Marital Status: Single    Spouse Name: N/A    Number of Children: N/A  . Years of Education: N/A   Occupational History  . Not on file.   Social History Main Topics  . Smoking status: Current Every Day Smoker -- 0.50 packs/day for 2 years    Types: Cigarettes  . Smokeless tobacco: Never Used  . Alcohol Use: No  . Drug Use: No  . Sexual Activity: Yes    Birth Control/ Protection: None   Other Topics Concern  . Not on file   Social History Narrative  . No narrative on file    Family History: History reviewed. No pertinent family history.  Review of Systems: Positive: Dysuria, frequency, abdominal pain, mild nausea, fever, decreased appetite Negative:  A further 10 point review of systems was negative except what is listed in the HPI.  Physical Exam: @VITALS2 @ General: He is in mild distress.  Awake. Head:  Normocephalic.  Atraumatic. ENT:  EOMI.  Mucous membranes moist Neck:  Supple.  No lymphadenopathy. CV:  S1 present. S2 present. Regular rate. Pulmonary: Slightly diminished effort..  Clear to auscultation bilaterally. Abdomen: Thin,  significant right upper, right lower and CVA tenderness. Mild guarding. Incisions are clean and dry. No significant erythema. Skin:  Normal turgor.  No visible rash. Extremity: No gross deformity of bilateral upper extremities.  No gross deformity of    bilateral lower extremities. Neurologic: Alert. Appropriate mood.    Studies:  Recent Labs     05/02/13  2020  HGB  14.8  WBC  10.6*  PLT  149*    Recent Labs     05/02/13  2020  NA  131*  K  4.1  CL  92*  CO2  28  BUN  14  CREATININE  0.84  CALCIUM  9.8  GFRNONAA  >90  GFRAA  >90      No results found for this basename: PT, INR, APTT,  in the last 72 hours   No components found with this basename: ABG,   I reviewed the patient's CT scan images, both independently and with Dr. Francene Boyers. On kidney delays, there does not seem to be any urinary leakage. There is no contrast in the ascending colon at the time of the CT imaging. There is the previously mentioned 5 x 9 centimeter fluid collection anterior and lateral to the kidney. Stent appears adequately positioned. There is minimal gas in this fluid collection, most likely consistent with his recent laparoscopic surgery. There is minimal subcutaneous gas, again consistent with recent surgery.  Assessment:  1. 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  2. Postoperative fever and fluid collection adjacent to the kidney. Most likely, this is an infected urinoma or blood collection. Less likely, a bowel injury.  Plan: 1. The patient is currently on adequate antibiotic management  2. I will have the patient rescanned in the near future, with the hopeful transit of the administered oral contrast getting to the ascending colon. Hopefully, this will evaluate for bowel injury. I will also have a Foley catheter placed and perform CT scan after administering contrast within the bladder-this will help with reflux and possibly assist in evaluating for extravasation from anastomosis.  3. If no bowel injury evident, will strongly consider CT directed drainage of this phlegmon.

## 2013-05-04 LAB — URINE CULTURE: Culture: NO GROWTH

## 2013-05-04 LAB — CBC WITH DIFFERENTIAL/PLATELET
Basophils Absolute: 0 10*3/uL (ref 0.0–0.1)
Eosinophils Absolute: 0.1 10*3/uL (ref 0.0–0.7)
Lymphocytes Relative: 13 % (ref 12–46)
MCHC: 35.6 g/dL (ref 30.0–36.0)
Monocytes Absolute: 1.1 10*3/uL — ABNORMAL HIGH (ref 0.1–1.0)
Neutro Abs: 6.3 10*3/uL (ref 1.7–7.7)
Neutrophils Relative %: 73 % (ref 43–77)
Platelets: 144 10*3/uL — ABNORMAL LOW (ref 150–400)
RDW: 11.6 % (ref 11.5–15.5)
WBC: 8.6 10*3/uL (ref 4.0–10.5)

## 2013-05-04 NOTE — Progress Notes (Signed)
Subjective: Patient reports that he is feeling a bit better. Catheter is not bothering him. Appetite good, no N/V.  Objective: Vital signs in last 24 hours: Temp:  [98.4 F (36.9 C)-98.8 F (37.1 C)] 98.8 F (37.1 C) (11/16 0614) Pulse Rate:  [71-84] 77 (11/16 0614) Resp:  [16-20] 18 (11/16 0614) BP: (109-117)/(62-71) 117/69 mmHg (11/16 0614) SpO2:  [92 %-100 %] 97 % (11/16 0614) FiO2 (%):  [44 %] 44 % (11/16 0329)  Intake/Output from previous day: 11/15 0701 - 11/16 0700 In: 1176.7 [P.O.:240; I.V.:936.7] Out: 1775 [Urine:1775] Intake/Output this shift:    Physical Exam:  Constitutional: Vital signs reviewed. WD WN in NAD   Eyes: PERRL, No scleral icterus.   Cardiovascular: RRR Pulmonary/Chest: Normal effort Abdominal: Continued but not worse RUQ,R flank tenderness. No edema or induration in this area. BS present. a   Lab Results:  Recent Labs  05/02/13 2020 05/03/13 0640  HGB 14.8 13.4  HCT 41.1 37.0*   BMET  Recent Labs  05/02/13 2020  NA 131*  K 4.1  CL 92*  CO2 28  GLUCOSE 115*  BUN 14  CREATININE 0.84  CALCIUM 9.8   No results found for this basename: LABPT, INR,  in the last 72 hours No results found for this basename: LABURIN,  in the last 72 hours Results for orders placed during the hospital encounter of 03/03/13  URINE CULTURE     Status: None   Collection Time    03/03/13 11:50 AM      Result Value Range Status   Specimen Description URINE, CLEAN CATCH   Final   Special Requests NONE   Final   Culture  Setup Time     Final   Value: 03/03/2013 18:05     Performed at [facility] Count     Final   Value: NO GROWTH     Performed at [facility]   Culture     Final   Value: NO GROWTH     Performed at [facility]   Report Status 03/04/2013 FINAL   Final    Studies/Results: Ct Abdomen Pelvis Wo Contrast  05/03/2013   CLINICAL DATA:  Status post pyeloplasty 04/28/2013 with abdominal pain and fever.   EXAM: CT ABDOMEN AND PELVIS WITHOUT CONTRAST  TECHNIQUE: Multidetector CT imaging of the abdomen and pelvis was performed following the standard protocol without intravenous contrast. 200cc of contrast were administered into the Foley catheter.  COMPARISON:  Abdominal pelvic CT of earlier in the morning.  Marland Kitchen  FINDINGS: Lower Chest: Clear lung bases. Normal heart size without pericardial or pleural effusion.  Abdomen/Pelvis: Stable appearance of the liver, spleen, stomach, pancreas. Increased density within the gallbladder is likely due to vicarious excretion of contrast. Normal adrenal glands and left kidney.  Right-sided ureteric stent remains in place. Moderate right-sided caliectasis. Ill-defined low-density collection anterior to the right renal pelvis is grossly similar on image 40, but suboptimally evaluated secondary to unenhanced technique. Intimately associated with the adjacent ascending colon. Hyperattenuating material more laterally is highly suspicious for contrast leak from the renal collecting system or pelvis. Example image 42/series 2. Mild residual enhancement in the lower pole right kidney from prior contrast-enhanced study suggests edema and likely pyelonephritis.  No retroperitoneal adenopathy.  Ascending colon is displaced. Cannot exclude secondary inflammation on image 49/series 2. Normal terminal ileum and appendix. Small bowel is normal in caliber, with redemonstration of tiny loculation of free intraperitoneal air, presumably postoperative.  No  pelvic adenopathy.  Moderate bladder distention. No evidence of contrast extravasation to suggest bladder injury. No bladder filling defect. Minimal air within the bladder which is likely iatrogenic.  Similar small volume cul-de-sac fluid which demonstrates increased density, suspicious for contrast. Example image 70/series 2.  Normal prostate.  Interval placement of a Foley catheter.  Small volume right-sided abdominal wall subcutaneous air is  similar.  Bones/Musculoskeletal:  No acute osseous abnormality.  IMPRESSION: 1. Moderate urinary bladder distention with contrast. No evidence of bladder injury or leak. 2. Right-sided ureteric stent in place. 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 New hyperattenuation laterally is most consistent with low volume leak from the right renal collecting system or pelvis. Similarly, complex cul-de-sac fluid, newly hyper attenuating since the study of earlier today, is suspicious for contrast leak from the right renal collecting system into the right pericolic gutter and intraperitoneal space of the pelvis. 3. Similar intraperitoneal and subcutaneous air since the exam of earlier today. 4. Cannot exclude secondary ascending colitis.   Electronically Signed   By: Jeronimo Greaves M.D.   On: 05/03/2013 14:48   Dg Chest 2 View  05/02/2013   CLINICAL DATA:  Cough, congestion, and fever for 4 days since kidney surgery last Monday. History of smoking.  EXAM: CHEST  2 VIEW  COMPARISON:  Chest x-ray 01/07/2013, abdominal CT on 03/03/2013  FINDINGS: Heart size is normal. The lungs are free of focal consolidations and pleural effusions. There Mild bronchitic changes. Note is made of a small amount of free intraperitoneal air, possibly related to recent surgery.  IMPRESSION: 1. Well bronchitic changes. 2.  No focal pulmonary abnormality. 3. Small amount of free intraperitoneal air, possibly postoperative.   Electronically Signed   By: Rosalie Gums M.D.   On: 05/02/2013 19:46   Ct Abdomen Pelvis W Contrast  05/03/2013   CLINICAL DATA:  Abdominal pain and fever. Shortness of breath. Recent right renal surgery.  EXAM: CT ABDOMEN AND PELVIS WITH CONTRAST  TECHNIQUE: Multidetector CT imaging of the abdomen and pelvis was performed using the standard protocol following bolus administration of intravenous contrast.  CONTRAST:  50mL OMNIPAQUE IOHEXOL 300 MG/ML SOLN, OMNIPAQUE  IOHEXOL 300 MG/ML SOLN  COMPARISON:  CT of the abdomen and pelvis performed 03/03/2013, and renal ultrasound performed 03/17/2013  FINDINGS: The visualized lung bases are clear.  Scattered tiny foci of free air within the abdomen are likely postoperative in nature. Associated soft tissue air is seen tracking along the right anterolateral abdominal wall.  The liver and spleen are unremarkable in appearance. The gallbladder is within normal limits. The pancreas and adrenal glands are unremarkable in appearance.  The patient is status post recent right-sided pyeloplasty and calyceal-renal pelvis anastomoses. There is a very large right-sided phlegmon, measuring approximately 10.0 x 9.5 x 4.8 cm. This encompasses the anterior aspect of the right kidney. Areas of fluid are noted throughout the phlegmon, particularly along the course of the right ureter; this demonstrates peripheral enhancement, raising concern for evolving abscess. Mildly decreased enhancement at the lower pole of the right kidney raises concern for underlying pyelonephritis.  The right ureteral stent is noted in expected position; mild soft tissue inflammation tracks along the course of the stent. A small amount of free fluid is noted within the pelvis, tracking inferiorly from the phlegmon at the right abdomen.  The left kidney is unremarkable. A small left-sided extrarenal pelvis is noted.  The small bowel is unremarkable in appearance. The stomach is within  normal limits. No acute vascular abnormalities are seen.  The appendix remains normal in caliber, without evidence for appendicitis. There is diffuse wall thickening involving the ascending colon at the site of the renal process, and the phlegmon described above involves a portion of the ascending colon. The remainder of the colon is partially filled with stool and is unremarkable in appearance.  The bladder is mildly distended and otherwise grossly unremarkable. The prostate remains normal in  size. No inguinal lymphadenopathy is seen.  No acute osseous abnormalities are identified.  IMPRESSION: 1. Very large right-sided phlegmon, measuring 10.0 x 9.5 x 4.8 cm, encompassing the anterior aspect of the right kidney. Areas of fluid seen throughout the phlegmon, particularly along the course of the right ureter, demonstrating peripheral enhancement. This raises concern for underlying evolving abscess. Mildly decreased enhancement at the lower pole of the right kidney raises concern for underlying pyelonephritis. 2. Right ureteral stent remains in expected position. Small amount of free fluid within the pelvis tracks inferiorly from the phlegmon at the right mid abdomen. 3. Diffuse wall thickening involving the ascending colon at the site of the renal process; the phlegmon includes a portion of the ascending colon. 4. Postoperative tiny foci of free air within the abdomen, within normal limits; scattered postoperative soft tissue air along the right lateral abdominal wall.  These results were called by telephone at the time of interpretation on 05/03/2013 at 2:07 AM to Dr. Sunnie Nielsen , who verbally acknowledged these results.   Electronically Signed   By: Roanna Raider M.D.   On: 05/03/2013 02:13    Assessment/Plan:   Status post right RAL pyeloplasty, with possible postop anastomotic leak. No evidence of bowel injury. Patient better w/ catheter drainage, currently afebrile on abx coverage. Will continue to observe, as he is clinically stable/improving. Discussed possible repeat scan in future to assess for organization of inflammatory process.  Pt understands/agrees. Will recheck CBC.   LOS: 2 days   Marcine Matar M 05/04/2013, 7:36 AM

## 2013-05-05 ENCOUNTER — Emergency Department (HOSPITAL_COMMUNITY)
Admission: EM | Admit: 2013-05-05 | Discharge: 2013-05-06 | Disposition: A | Discharge status: Home / Self Care | Payer: BC Managed Care – PPO | Attending: Emergency Medicine | Admitting: Emergency Medicine

## 2013-05-05 ENCOUNTER — Emergency Department (HOSPITAL_COMMUNITY): Payer: BC Managed Care – PPO

## 2013-05-05 ENCOUNTER — Encounter (HOSPITAL_COMMUNITY): Payer: Self-pay | Admitting: Emergency Medicine

## 2013-05-05 DIAGNOSIS — F17200 Nicotine dependence, unspecified, uncomplicated: Secondary | ICD-10-CM | POA: Insufficient documentation

## 2013-05-05 DIAGNOSIS — Z79899 Other long term (current) drug therapy: Secondary | ICD-10-CM | POA: Insufficient documentation

## 2013-05-05 DIAGNOSIS — Z792 Long term (current) use of antibiotics: Secondary | ICD-10-CM | POA: Insufficient documentation

## 2013-05-05 DIAGNOSIS — Z87442 Personal history of urinary calculi: Secondary | ICD-10-CM | POA: Insufficient documentation

## 2013-05-05 DIAGNOSIS — R109 Unspecified abdominal pain: Secondary | ICD-10-CM

## 2013-05-05 DIAGNOSIS — R1031 Right lower quadrant pain: Secondary | ICD-10-CM | POA: Insufficient documentation

## 2013-05-05 LAB — BASIC METABOLIC PANEL
Calcium: 9.6 mg/dL (ref 8.4–10.5)
Chloride: 97 mEq/L (ref 96–112)
Creatinine, Ser: 0.67 mg/dL (ref 0.50–1.35)
GFR calc Af Amer: >90 mL/min (ref >90)
GFR calc non Af Amer: >90 mL/min (ref >90)
Glucose, Bld: 105 mg/dL — ABNORMAL HIGH (ref 70–99)

## 2013-05-05 LAB — CBC WITH DIFFERENTIAL/PLATELET
Basophils Absolute: 0 10*3/uL (ref 0.0–0.1)
Basophils Relative: 0 % (ref 0–1)
Hemoglobin: 12.1 g/dL — ABNORMAL LOW (ref 13.0–17.0)
Lymphs Abs: 1.2 10*3/uL (ref 0.7–4.0)
MCV: 89.6 fL (ref 78.0–100.0)
Monocytes Absolute: 0.8 10*3/uL (ref 0.1–1.0)
Monocytes Relative: 9 % (ref 3–12)
Platelets: 174 10*3/uL (ref 150–400)
RBC: 3.85 MIL/uL — ABNORMAL LOW (ref 4.22–5.81)
RDW: 11.5 % (ref 11.5–15.5)
WBC: 8.8 10*3/uL (ref 4.0–10.5)

## 2013-05-05 LAB — URINALYSIS, ROUTINE W REFLEX MICROSCOPIC
Bilirubin Urine: NEGATIVE
Urobilinogen, UA: 0.2 mg/dL (ref 0.0–1.0)

## 2013-05-05 LAB — URINE MICROSCOPIC-ADD ON

## 2013-05-05 MED ORDER — IOHEXOL 300 MG/ML  SOLN
100.0000 mL | Freq: Once | INTRAMUSCULAR | Status: AC | PRN
  Administered 2013-05-05: 100 mL via INTRAVENOUS

## 2013-05-05 MED ORDER — HYDROMORPHONE HCL 2 MG PO TABS
2.0000 mg | ORAL_TABLET | ORAL | Status: DC | PRN
  Administered 2013-05-05: 2 mg via ORAL
  Filled 2013-05-05: qty 1

## 2013-05-05 MED ORDER — SODIUM CHLORIDE 0.9 % IV SOLN
Freq: Once | INTRAVENOUS | Status: AC
  Administered 2013-05-05: 22:00:00 via INTRAVENOUS

## 2013-05-05 MED ORDER — HYDROMORPHONE HCL PF 1 MG/ML IJ SOLN
1.0000 mg | Freq: Once | INTRAMUSCULAR | Status: AC
  Administered 2013-05-06: 1 mg via INTRAVENOUS
  Filled 2013-05-05: qty 1

## 2013-05-05 MED ORDER — ONDANSETRON HCL 4 MG/2ML IJ SOLN
4.0000 mg | Freq: Once | INTRAMUSCULAR | Status: AC
  Administered 2013-05-06: 4 mg via INTRAVENOUS
  Filled 2013-05-05: qty 2

## 2013-05-05 MED ORDER — HYDROMORPHONE HCL PF 1 MG/ML IJ SOLN
1.0000 mg | Freq: Once | INTRAMUSCULAR | Status: AC
  Administered 2013-05-05: 1 mg via INTRAVENOUS
  Filled 2013-05-05: qty 1

## 2013-05-05 MED ORDER — CIPROFLOXACIN HCL 250 MG PO TABS: 250.0000 mg | ORAL_TABLET | Freq: Two times a day (BID) | ORAL | Status: DC

## 2013-05-05 NOTE — ED Notes (Signed)
Pt arrives with urinary catheter in place. Foley clamped at this time to obtain urine sample, will reassess

## 2013-05-05 NOTE — ED Notes (Signed)
Patient transported to CT 

## 2013-05-05 NOTE — Discharge Instructions (Signed)
Please refer to last discharge summary for instructions.  Continue to ambulate about every hour during the daytime.

## 2013-05-05 NOTE — ED Provider Notes (Signed)
CSN: [id number]     Arrival date & time 05/05/13  1942 History   First MD Initiated Contact with Patient 05/05/13 2139      This chart was scribed for Lyanne Co, MD by Arlan Organ, ED Scribe. This patient was seen in room APA09/APA09 and the patient's care was started 9:43 PM.   Chief Complaint  Patient presents with  . Abdominal Pain   The history is provided by the patient. No language interpreter was used.   HPI Comments: Steve Colon is a 17 y.o. male who presents to the Emergency Department complaining of sudden onset, gradually worsening, constant RLQ abdominal pain that started about 2 hours ago. Pt describes the pain as "sharp", and states the pain was intense enough to cause him to tear. Pt was given dilaudid at previous time of visit, and has been taking with no relief. He states he recently had a pyeloplasty last week at [facility] and was successfully discharged. He says he was seen here in the ED on 11/14 for fever and nausea. He says he was then transferred back to [facility], and was discharged today. Pt was given antibiotics but was unable to get his medication filled today. Pt denies nausea, fever, emesis.  Past Medical History  Diagnosis Date  . Kidney stones    Past Surgical History  Procedure Laterality Date  . Eye surgery  4 yrs ago    to straighte eyes  . Robot assisted pyeloplasty Right 04/28/2013    Procedure: ROBOTIC ASSISTED PYELOPLASTY/ RIGHT PYELOLITHOTOMY;  Surgeon: Crecencio Mc, MD;  Location: [facility] ORS;  Service: Urology;  Laterality: Right;  . Cystoscopy w/ ureteral stent placement Right 04/28/2013    Procedure: CYSTOSCOPY WITH RIGHT RETROGRADE PYELOGRAM/RIGHT URETERAL STENT PLACEMENT;  Surgeon: Crecencio Mc, MD;  Location: [facility];  Service: Urology;  Laterality: Right;   History reviewed. No pertinent family history. History  Substance Use Topics  . Smoking status: Current Every Day Smoker -- 0.50 packs/day for 2 years    Types: Cigarettes  . Smokeless tobacco:  Never Used  . Alcohol Use: No    Review of Systems A complete 10 system review of systems was obtained and all systems are negative except as noted in the HPI and PMH.    Allergies  Codeine; Toradol; Tramadol; and Barium-containing compounds  Home Medications   Current Outpatient Rx  Name  Route  Sig  Dispense  Refill  . HYDROmorphone (DILAUDID) 2 MG tablet   Oral   Take 1 tablet (2 mg total) by mouth every 3 (three) hours as needed.   50 tablet   0   . ciprofloxacin (CIPRO) 250 MG tablet   Oral   Take 1 tablet (250 mg total) by mouth 2 (two) times daily.   14 tablet   0   . docusate sodium 100 MG CAPS   Oral   Take 100 mg by mouth 2 (two) times daily.   10 capsule   0   . ondansetron (ZOFRAN ODT) 8 MG disintegrating tablet   Oral   Take 1 tablet (8 mg total) by mouth every 8 (eight) hours as needed for nausea or vomiting.   10 tablet   0    Triage Vitals: BP 126/60  Pulse 72  Temp(Src) 98.3 F (36.8 C) (Oral)  Resp 24  Ht 5\' 5"  (1.651 m)  Wt 135 lb (61.236 kg)  BMI 22.47 kg/m2  SpO2 99%  Physical Exam  Nursing note and vitals reviewed. Constitutional: He  is oriented to person, place, and time. He appears well-developed and well-nourished.  HENT:  Head: Normocephalic and atraumatic.  Eyes: EOM are normal.  Neck: Normal range of motion.  Cardiovascular: Normal rate, regular rhythm, normal heart sounds and intact distal pulses.   Pulmonary/Chest: Effort normal and breath sounds normal. No respiratory distress.  Abdominal: Soft. He exhibits no distension. There is tenderness. There is guarding.  Tenderness to palpation over right side of abdomen with gaurding  Musculoskeletal: Normal range of motion.  Neurological: He is alert and oriented to person, place, and time.  Skin: Skin is warm and dry.  Psychiatric: He has a normal mood and affect. Judgment normal.    ED Course  Procedures (including critical care time)  DIAGNOSTIC STUDIES: Oxygen  Saturation is 99% on RA, Normal by my interpretation.    COORDINATION OF CARE: 9:48 PM- Will give dilaudid. Will order X-Ray of abdomen, urinalysis, and blood work. Discussed treatment plan with pt at bedside and pt agreed to plan.     Labs Review Labs Reviewed  URINALYSIS, ROUTINE W REFLEX MICROSCOPIC - Abnormal; Notable for the following:    APPearance HAZY (*)    Glucose, UA 100 (*)    Hgb urine dipstick MODERATE (*)    Protein, ur TRACE (*)    All other components within normal limits  CBC WITH DIFFERENTIAL - Abnormal; Notable for the following:    RBC 3.85 (*)    Hemoglobin 12.1 (*)    HCT 34.5 (*)    All other components within normal limits  BASIC METABOLIC PANEL - Abnormal; Notable for the following:    Sodium 134 (*)    Glucose, Bld 105 (*)    All other components within normal limits  URINE MICROSCOPIC-ADD ON - Abnormal; Notable for the following:    Bacteria, UA MANY (*)    All other components within normal limits  URINE CULTURE   Imaging Review Ct Abdomen Pelvis W Contrast  05/05/2013   CLINICAL DATA:  Abdominal pain on the right.  Recent pyeloplasty  EXAM: CT ABDOMEN AND PELVIS WITH CONTRAST  TECHNIQUE: Multidetector CT imaging of the abdomen and pelvis was performed using the standard protocol following bolus administration of intravenous contrast.  CONTRAST:  OMNIPAQUE IOHEXOL 300 MG/ML  SOLN  COMPARISON:  05/03/2013  FINDINGS: BODY WALL: No increase in subcutaneous and intra-abdominal gas in the right abdomen.  LOWER CHEST: Unremarkable.  ABDOMEN/PELVIS:  Liver: No focal abnormality.  Biliary: No evidence of biliary obstruction or stone.  Pancreas: Unremarkable.  Spleen: Unremarkable.  Adrenals: Unremarkable.  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 The largest pocket is now 2.3 x 3.2 cm,  previously 2.4 x 1.5 cm. Although is difficult to differentiate the collecting system from the urinoma, no definite increase in the chronic hydronephrosis. Renal enhancement is similar to prior, with a wedge-shaped area of hypo enhancement inferiorly which may represent infection or remote small vessel compromise. Stones again the noted in the lower pole right kidney. Unchanged appearance of the left kidney, including pelviectasis. The fluid in the pelvic recesses is slightly decreased. Residual high attenuation noted dependently within the fluid. No evidence of aneurysm or active extravasation. A right-sided ureteral stent is in stable position, with upper and lower limbs fully formed.  Bladder: Foley catheter present. The bladder is mildly distended with urine.  Reproductive: Unremarkable.  Bowel: No obstruction. Normal appendix.  Retroperitoneum: No mass or adenopathy.  Vascular: No acute abnormality.  OSSEOUS: No acute abnormalities.  IMPRESSION: 1. 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 The collection is of indeterminate sterility. Decreasing intraperitoneal fluid/urine. 2. A right ureteral stent is in good position. Despite the mild urinoma increase, no definite increase in right-sided hydronephrosis to suggest stent failure.   Electronically Signed   By: Tiburcio Pea M.D.   On: 05/05/2013 23:54  I personally reviewed the imaging tests through PACS system I reviewed available ER/hospitalization records through the EMR   EKG Interpretation   None       MDM   1. Right flank pain    12:46 AM Patient still having some pain at this time however seems like he is doing slightly better.  I discussed his case at length with Dr. Margarita Grizzle of urology.  Dr. Margarita Grizzle was able to visualize the images at home and based on my description of the patient he agrees that the patient is safe for discharge home.  The patient will followup with Dr. Laverle Patter later this  morning in the office and call for followup..  No fever.  White count is normal.  Some of this may represent stent-related pain.  I personally performed the services described in this documentation, which was scribed in my presence. The recorded information has been reviewed and is accurate.      Lyanne Co, MD 05/06/13 [phone]

## 2013-05-05 NOTE — ED Notes (Signed)
Pt reporting pain in right side of abdomen beginning tonight.  Reports that he had kidney surgery last week at [facility]. Pt reports that he was seen here in department on Friday due to fever and was transferred to [facility].  Catheter draining with no difficulty.

## 2013-05-05 NOTE — ED Notes (Signed)
Pt and family angry that he will not be moved to a room immediately.  Reinforced with pt and family, that he is currently in stable condition and that patients need to be seen based on acuity levels.  Reassured that pt will be moved to room as soon as possible.

## 2013-05-05 NOTE — Progress Notes (Signed)
7ml Dilaudid wasted from d/c'ed PCA in sink. Witnessed by [employer], Charity fundraiser.

## 2013-05-05 NOTE — Progress Notes (Signed)
Patient ID: Steve Colon, male   DOB: [birth year], 17 y.o.   MRN: [id number]    Subjective: Pt readmitted on Saturday after presenting to [facility] with fever and flank pain when voiding.  CT scan with contrast demonstrated a collection anterior to the renal pelvis that was ill-defined possibly representing urinoma, hematoma, or early abscess formation. He was transferred to [facility]. He had a urine culture that is now returned negative. He has not had a leukocytosis and has been afebrile since hospital admission. He had a catheter placed with resolution of flank pain over the past 36 hours.  Objective: Vital signs in last 24 hours: Temp:  [98.8 F (37.1 C)-99.4 F (37.4 C)] 99.1 F (37.3 C) (11/17 0619) Pulse Rate:  [80-83] 80 (11/17 0619) Resp:  [15-21] 16 (11/17 0619) BP: (103-121)/(57-67) 117/58 mmHg (11/17 0619) SpO2:  [97 %-98 %] 98 % (11/17 0619)  Intake/Output from previous day: 11/16 0701 - 11/17 0700 In: 5478.9 [P.O.:1320; I.V.:4058.9; IV Piggyback:100] Out: 2600 [Urine:2600] Intake/Output this shift:    Physical Exam:  General: Alert and oriented CV: RRR Lungs: Clear Abdomen: Soft, ND, Positive BS, Tender across abdomen and incisional areas, Minimal R CVAT Incisions: C/D/I Ext: NT, No erythema  Lab Results:  Recent Labs  05/02/13 2020 05/03/13 0640 05/04/13 0848  HGB 14.8 13.4 11.7*  HCT 41.1 37.0* 32.9*   BMET  Recent Labs  05/02/13 2020  NA 131*  K 4.1  CL 92*  CO2 28  GLUCOSE 115*  BUN 14  CREATININE 0.84  CALCIUM 9.8   Lab Results  Component Value Date   WBC 8.6 05/04/2013   HGB 11.7* 05/04/2013   HCT 32.9* 05/04/2013   MCV 88.4 05/04/2013   PLT 144* 05/04/2013    I have independently reviewed his CT scan including his repeat CT on 11/15 which basically served as a delayed study.  This demonstrated some likely extravasation from the renal pelvis.  No evidence to suggest bowel injury.   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  Will transition to oral antibiotics as prophylaxis.  I have recommended we keep his Foley catheter and I will discharge him home with plans to re-image in about one week with a CT scan.  He has been instructed to call if he should develop fever > 101.  He expressed his understanding and was agreeable to this plan.  He will be instructed on catheter care.   LOS: 3 days   [name],LES 05/05/2013, 7:31 AM

## 2013-05-05 NOTE — Progress Notes (Signed)
Foley care/leg bag teaching reviewed w/ pt and wife. Both verbalized and demonstrated understanding and competence w/ urinary catheter care. Leg bag, foley bag, and alcohol swabs provided to pt. D/C instructions reviewed w/ pt and wife. All questions answered, no further questions. Both verbalized understanding. Pt d/c in w/c by NT in stable condition to wife's car. Pt in possession of d/ c instructions, script, and all personal belongings.

## 2013-05-05 NOTE — Discharge Summary (Signed)
  Date of admission: 05/02/2013  Date of discharge: 05/05/2013  Admission diagnosis: Right flank pain s/p pyeloplasty, fever, urinoma  Discharge diagnosis: Same  History and Physical: For full details, please see admission history and physical. Briefly, Steve Colon is a 17 y.o. year old patient who is status post right robotic-assisted laparoscopic pyeloplasty last week. He presented to the [facility] emergency department on Friday evening with complaints of fever of 101.  He underwent a CT scan which revealed a fluid collection anterior to the renal pelvis. He was then transferred to [facility] under the direction of Dr. Marcine Matar for further evaluation and care.   Hospital Course: on further discussion, it was noted that his right flank pain.  3 worse when he urinated.  This was consistent with his indwelling ureteral stent.  A Foley catheter was placed which did help to improve his pain symptoms.  He remained afebrile for 48 hours while admitted to the hospital and his white blood count was within normal limits.  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  This could've possibly been related to postoperative hematoma, urinoma, or possibly early abscess formation.  His urine culture subsequently returned negative although he did remain on IV Rocephin throughout his hospitalization.  A delayed CT scan without contrast was performed which basically served as a delayed imaging study from his initial CT scan.  This did reveal contrast extravasation without evidence of bowel injury.  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  I evaluated him on 05/05/13 and he appeared to be stable with pain controlled.  It was felt that he would be stable for discharge with his Foley catheter to allow further healing of his UPJ repair.  It was decided to discharge him with low-dose ciprofloxacin  prophylactically and he did receive instruction on Foley catheter care.  I will plan to have him follow-up in one week with a repeat CT scan including delayed imaging to ensure that his UPJ repair has completely healed prior to consideration of catheter removal.  Laboratory values:  Recent Labs  05/02/13 2020 05/03/13 0640 05/04/13 0848  HGB 14.8 13.4 11.7*  HCT 41.1 37.0* 32.9*    Recent Labs  05/02/13 2020  CREATININE 0.84    Disposition: Home  Discharge instruction: The patient was instructed to be ambulatory but told to refrain from heavy lifting, strenuous activity, or driving.   Discharge medications:    Medication List    STOP taking these medications       EX-LAX 15 MG Tabs  Generic drug:  Sennosides      TAKE these medications       ciprofloxacin 250 MG tablet  Commonly known as:  CIPRO  Take 1 tablet (250 mg total) by mouth 2 (two) times daily.     DSS 100 MG Caps  Take 100 mg by mouth 2 (two) times daily.     HYDROmorphone 2 MG tablet  Commonly known as:  DILAUDID  Take 1 tablet (2 mg total) by mouth every 3 (three) hours as needed.        Followup:      Follow-up Information   Follow up with Crecencio Mc, MD. (Will call to schedule)    Specialty:  Urology   Contact information:   [address] [place] [address] [phone]

## 2013-05-06 MED ORDER — DIAZEPAM 5 MG PO TABS
5.0000 mg | ORAL_TABLET | Freq: Once | ORAL | Status: AC
  Administered 2013-05-06: 5 mg via ORAL
  Filled 2013-05-06: qty 1

## 2013-05-06 MED ORDER — BELLADONNA ALKALOIDS-OPIUM 16.2-60 MG RE SUPP
1.0000 | Freq: Once | RECTAL | Status: AC
  Administered 2013-05-06: 1 via RECTAL
  Filled 2013-05-06: qty 1

## 2013-05-06 MED ORDER — ONDANSETRON 8 MG PO TBDP
8.0000 mg | ORAL_TABLET | Freq: Three times a day (TID) | ORAL | Status: DC | PRN
Start: 2013-05-06 — End: 2023-01-31

## 2013-05-06 MED ORDER — HYDROMORPHONE HCL PF 1 MG/ML IJ SOLN
1.0000 mg | Freq: Once | INTRAMUSCULAR | Status: AC
  Administered 2013-05-06: 1 mg via INTRAVENOUS
  Filled 2013-05-06: qty 1

## 2013-05-06 NOTE — ED Notes (Signed)
MD at bedside to discuss disposition 

## 2013-05-06 NOTE — ED Notes (Signed)
Pt says he wants to go home because his wife has to sleep. He says he will take his own meds when he gets home

## 2013-05-07 ENCOUNTER — Emergency Department (HOSPITAL_COMMUNITY): Payer: BC Managed Care – PPO

## 2013-05-07 ENCOUNTER — Encounter (HOSPITAL_COMMUNITY): Payer: Self-pay | Admitting: Emergency Medicine

## 2013-05-07 ENCOUNTER — Emergency Department (HOSPITAL_COMMUNITY)
Admission: EM | Admit: 2013-05-07 | Discharge: 2013-05-07 | Disposition: A | Discharge status: Home / Self Care | Payer: BC Managed Care – PPO | Attending: Emergency Medicine | Admitting: Emergency Medicine

## 2013-05-07 DIAGNOSIS — T83091A Other mechanical complication of indwelling urethral catheter, initial encounter: Secondary | ICD-10-CM | POA: Insufficient documentation

## 2013-05-07 DIAGNOSIS — F17200 Nicotine dependence, unspecified, uncomplicated: Secondary | ICD-10-CM | POA: Insufficient documentation

## 2013-05-07 DIAGNOSIS — Y846 Urinary catheterization as the cause of abnormal reaction of the patient, or of later complication, without mention of misadventure at the time of the procedure: Secondary | ICD-10-CM | POA: Insufficient documentation

## 2013-05-07 DIAGNOSIS — R3989 Other symptoms and signs involving the genitourinary system: Secondary | ICD-10-CM | POA: Insufficient documentation

## 2013-05-07 DIAGNOSIS — Z792 Long term (current) use of antibiotics: Secondary | ICD-10-CM | POA: Insufficient documentation

## 2013-05-07 DIAGNOSIS — Z79899 Other long term (current) drug therapy: Secondary | ICD-10-CM | POA: Insufficient documentation

## 2013-05-07 DIAGNOSIS — T839XXA Unspecified complication of genitourinary prosthetic device, implant and graft, initial encounter: Secondary | ICD-10-CM

## 2013-05-07 DIAGNOSIS — R300 Dysuria: Secondary | ICD-10-CM | POA: Insufficient documentation

## 2013-05-07 DIAGNOSIS — R109 Unspecified abdominal pain: Secondary | ICD-10-CM | POA: Insufficient documentation

## 2013-05-07 DIAGNOSIS — Z87442 Personal history of urinary calculi: Secondary | ICD-10-CM | POA: Insufficient documentation

## 2013-05-07 LAB — URINALYSIS, ROUTINE W REFLEX MICROSCOPIC
Glucose, UA: NEGATIVE mg/dL
Ketones, ur: NEGATIVE mg/dL
Specific Gravity, Urine: 1.026 (ref 1.005–1.030)
pH: 7.5 (ref 5.0–8.0)

## 2013-05-07 LAB — URINE MICROSCOPIC-ADD ON

## 2013-05-07 LAB — URINE CULTURE

## 2013-05-07 MED ORDER — ONDANSETRON 4 MG PO TBDP
4.0000 mg | ORAL_TABLET | Freq: Once | ORAL | Status: AC
  Administered 2013-05-07: 4 mg via ORAL
  Filled 2013-05-07: qty 1

## 2013-05-07 MED ORDER — HYDROMORPHONE HCL PF 1 MG/ML IJ SOLN
1.0000 mg | Freq: Once | INTRAMUSCULAR | Status: AC
  Administered 2013-05-07: 1 mg via INTRAMUSCULAR
  Filled 2013-05-07: qty 1

## 2013-05-07 MED ORDER — ONDANSETRON 4 MG PO TBDP: ORAL_TABLET | ORAL | Status: DC

## 2013-05-07 MED ORDER — ONDANSETRON 4 MG PO TBDP
4.0000 mg | ORAL_TABLET | Freq: Once | ORAL | Status: AC
Start: 2013-05-07 — End: 2013-05-07
  Administered 2013-05-07: 4 mg via ORAL
  Filled 2013-05-07: qty 1

## 2013-05-07 NOTE — ED Notes (Signed)
Pt reports that he had kidney surgery on 11/10 for a mal-rotated kidney and placed a stent for an obstruction, pt has had a urinary cath since Saturday and tonight began voiding around the catheter with a feeling of urgency to void. Pt changed his standard drainage bag for a leg bag and cath began draining again, but 2 hours ago pt again felt the urge to urinate and voided a large amount around the catheter.  States that he is easily able to manipulate the cath in and out and feels the balloon as deflated. Last took pain medication x2 hours ago.

## 2013-05-07 NOTE — ED Notes (Signed)
Pt refused to have a new foley cath placed; pt states, "I don't want anymore cath inserted; please ask the doctor if I could do without it". Fluids offered to stimulate bladder emptying--- pt was advised that he can be without catheter if he voids freely.

## 2013-05-07 NOTE — ED Provider Notes (Signed)
CSN: [id number]     Arrival date & time 05/07/13  0118 History   First MD Initiated Contact with Patient 05/07/13 0144     Chief Complaint  Patient presents with  . Urinary Retention   (Consider location/radiation/quality/duration/timing/severity/associated sxs/prior Treatment) HPI Patient had a right-sided hilar plasty with ureteral stent placement on 04/28/13 and by Dr. Laverle Patter. He was seen yesterday evening for right sided pain in the emergency department. He had a CT which showed the stent in good positioning and a urinoma on the right side. The case was discussed with the urologist on call and patient was felt safe to discharge and followup the next day. Patient states he was not able to followup with suicidal P1 $160 to see the urologist. This evening he began having urine flow around the catheter site when he strained to urinate. He continues to have right-sided pain. He denies any fevers or chills. Urine is now free flowing into the catheter bag. No hematuria. He discussed the case with the urology nurse on call and was advised to come to the emergency department. Past Medical History  Diagnosis Date  . Kidney stones    Past Surgical History  Procedure Laterality Date  . Eye surgery  4 yrs ago    to straighte eyes  . Robot assisted pyeloplasty Right 04/28/2013    Procedure: ROBOTIC ASSISTED PYELOPLASTY/ RIGHT PYELOLITHOTOMY;  Surgeon: Crecencio Mc, MD;  Location: [facility];  Service: Urology;  Laterality: Right;  . Cystoscopy w/ ureteral stent placement Right 04/28/2013    Procedure: CYSTOSCOPY WITH RIGHT RETROGRADE PYELOGRAM/RIGHT URETERAL STENT PLACEMENT;  Surgeon: Crecencio Mc, MD;  Location: [facility];  Service: Urology;  Laterality: Right;   History reviewed. No pertinent family history. History  Substance Use Topics  . Smoking status: Current Every Day Smoker -- 0.50 packs/day for 2 years    Types: Cigarettes  . Smokeless tobacco: Never Used  . Alcohol Use: No    Review of Systems   Constitutional: Negative for fever and chills.  Respiratory: Negative for shortness of breath.   Cardiovascular: Negative for chest pain.  Gastrointestinal: Positive for abdominal pain. Negative for nausea, vomiting and diarrhea.  Genitourinary: Positive for dysuria and difficulty urinating. Negative for frequency, hematuria, flank pain and penile pain.  Musculoskeletal: Negative for back pain, neck pain and neck stiffness.  Skin: Negative for rash.  Neurological: Negative for dizziness, weakness, light-headedness, numbness and headaches.  All other systems reviewed and are negative.    Allergies  Codeine; Toradol; Tramadol; and Barium-containing compounds  Home Medications   Current Outpatient Rx  Name  Route  Sig  Dispense  Refill  . ciprofloxacin (CIPRO) 250 MG tablet   Oral   Take 1 tablet (250 mg total) by mouth 2 (two) times daily.   14 tablet   0   . docusate sodium 100 MG CAPS   Oral   Take 100 mg by mouth 2 (two) times daily.   10 capsule   0   . HYDROmorphone (DILAUDID) 2 MG tablet   Oral   Take 1 tablet (2 mg total) by mouth every 3 (three) hours as needed.   50 tablet   0   . ondansetron (ZOFRAN ODT) 8 MG disintegrating tablet   Oral   Take 1 tablet (8 mg total) by mouth every 8 (eight) hours as needed for nausea or vomiting.   10 tablet   0    BP 124/77  Pulse 69  Temp(Src) 97.8 F (36.6 C) (Oral)  Resp 20  Ht 5\' 5"  (1.651 m)  Wt 125 lb (56.7 kg)  BMI 20.80 kg/m2  SpO2 100% Physical Exam  Nursing note and vitals reviewed. Constitutional: He is oriented to person, place, and time. He appears well-developed and well-nourished. No distress.  HENT:  Head: Normocephalic and atraumatic.  Mouth/Throat: Oropharynx is clear and moist.  Eyes: EOM are normal. Pupils are equal, round, and reactive to light.  Neck: Normal range of motion. Neck supple.  Cardiovascular: Normal rate and regular rhythm.   Pulmonary/Chest: Effort normal and breath sounds  normal. No respiratory distress. He has no wheezes. He has no rales. He exhibits no tenderness.  Abdominal: Soft. Bowel sounds are normal. He exhibits no distension and no mass. There is tenderness (diffuse right-sided tenderness.). There is no rebound and no guarding.  Well healing surgical wounds without evidence of infection. No supra-pubic tenderness  Genitourinary:  Foley cath in correct position. There is no leakage of urine around the catheter site.  Musculoskeletal: Normal range of motion. He exhibits no edema and no tenderness.  Neurological: He is alert and oriented to person, place, and time.  Skin: Skin is warm and dry. No rash noted. No erythema.  Psychiatric: He has a normal mood and affect. His behavior is normal.    ED Course  Procedures (including critical care time) Labs Review Labs Reviewed  URINALYSIS, ROUTINE W REFLEX MICROSCOPIC   Imaging Review Ct Abdomen Pelvis W Contrast  05/05/2013   CLINICAL DATA:  Abdominal pain on the right.  Recent pyeloplasty  EXAM: CT ABDOMEN AND PELVIS WITH CONTRAST  TECHNIQUE: Multidetector CT imaging of the abdomen and pelvis was performed using the standard protocol following bolus administration of intravenous contrast.  CONTRAST:  OMNIPAQUE IOHEXOL 300 MG/ML  SOLN  COMPARISON:  05/03/2013  FINDINGS: BODY WALL: No increase in subcutaneous and intra-abdominal gas in the right abdomen.  LOWER CHEST: Unremarkable.  ABDOMEN/PELVIS:  Liver: No focal abnormality.  Biliary: No evidence of biliary obstruction or stone.  Pancreas: Unremarkable.  Spleen: Unremarkable.  Adrenals: Unremarkable.  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 The largest pocket is now 2.3 x 3.2 cm, previously 2.4 x 1.5 cm. Although is difficult to differentiate the collecting system from the urinoma,  no definite increase in the chronic hydronephrosis. Renal enhancement is similar to prior, with a wedge-shaped area of hypo enhancement inferiorly which may represent infection or remote small vessel compromise. Stones again the noted in the lower pole right kidney. Unchanged appearance of the left kidney, including pelviectasis. The fluid in the pelvic recesses is slightly decreased. Residual high attenuation noted dependently within the fluid. No evidence of aneurysm or active extravasation. A right-sided ureteral stent is in stable position, with upper and lower limbs fully formed.  Bladder: Foley catheter present. The bladder is mildly distended with urine.  Reproductive: Unremarkable.  Bowel: No obstruction. Normal appendix.  Retroperitoneum: No mass or adenopathy.  Vascular: No acute abnormality.  OSSEOUS: No acute abnormalities.  IMPRESSION: 1. 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 The collection is of indeterminate sterility. Decreasing intraperitoneal fluid/urine. 2. A right ureteral stent is in good position. Despite the mild urinoma increase, no definite increase in right-sided hydronephrosis to suggest stent failure.   Electronically Signed   By: Tiburcio Pea M.D.   On: 05/05/2013 23:54  EKG Interpretation   None       MDM    Urinary catheter was removed the patient is refusing replacement. We'll observe in the emergency department to see the patient is able to void.  Patient is urinating in the emergency department without difficulty. He is tolerating by mouth's. Advised patient he is to followup with his urologist today. He's been given discharge instructions return precautions.  Loren Racer, MD 05/07/13 [phone]

## 2013-05-07 NOTE — ED Notes (Signed)
Renal Ultrasound in process at bedside at this time.

## 2013-05-07 NOTE — ED Notes (Signed)
Pt's foley cath in place--- leg bag was emptied earlier; leg bag has approximately 150 ml of yellow-colored urine at this time, no s/s urine leakage from urethral meatus.

## 2013-05-07 NOTE — ED Notes (Signed)
Pt able to urinate 50 ml of yellow urine.

## 2013-05-08 ENCOUNTER — Telehealth: Payer: Self-pay | Admitting: Urology

## 2013-05-08 NOTE — Telephone Encounter (Signed)
I noted that Steve Colon had presented to the [facility] emergency room last evening with complaints of his urethral catheter leaking. I reviewed the notes from that visit.  Steve Colon had actually called our office on 05/06/13 during the daytime in complaining that his catheter was not draining well.  He was recommended to present to our office that day for further evaluation.  However, he refused to come to our office.  This was apparently related to transportation issues and also his unwillingness to pay on a past due balance. However, he was not denied the option of being seen in our office. It was recommended to him at that time that he not continue to present to [facility] as I was concerned he was receiving unnecessary CT scans due to the fact he was being evaluated by people unfamiliar with his surgery and situation.  He did present to the [facility] emergency room last night but did not call us yesterday to notify us that he would be going there.  During the emergency room visit, his catheter was removed.  The patient apparently refused to have his catheter replaced.  I was personally on call for urology last night but unfortunately did not receive notice that he was in the emergency room to provide advice about his need for a catheter.  After reviewing the notes of his visit last night, I called Steve Colon and spoke with him personally today.  I told him that it was harmful for him not to have a catheter at this point considering he does have a probable urine leak from his renal pelvis.  I expressed the importance of optimizing drainage of his urinary tract especially considering that he does have a right ureteral stent in place and that he may actually increase pressure in his renal pelvis without a urethral catheter.  He is very hesitant to have a catheter replaced and I told him the alternative option would be to place a percutaneous nephrostomy tube into the right kidney.  Considering these options and  after a discussion regarding the potential harm including development of infection, persistent urinoma formation, and failure to heal his anastomosis of his UPJ repair, he agreed then a catheter replaced.  I recommended that this be performed as soon as possible.  However, he refused to come to the office today but stated that he would come tomorrow morning.  The plan is therefore to replace the catheter tomorrow and to plan to proceed with a CT scan 7-10 days from that time to assess resolution of his urine leak.  Steve Colon also requested additional pain medication.  He was informed that I cannot call him in narcotic pain medication and can only provide him a prescription in person.  I also expressed my concern that he should not need further pain medication at this point considering the amount of narcotic pain medication that he was provided upon discharge from the hospital and especially considering the fact that he had been admitted to the hospital last weekend at which time he was receiving IV pain medication.  He also requested a prescription for Valium and I expressed my concern about him being overmedicated.  Steve Colon has continued to concern me about the possibility of narcotic abuse and I expressed my concern to him today about this issue and the fact that he has required a significant more amount of narcotic pain medication and any patient perform this operation on.  He also required a significant more amount of pain medication  even prior to the operation for his particular diagnosis.

## 2013-09-08 ENCOUNTER — Other Ambulatory Visit (HOSPITAL_COMMUNITY): Payer: Self-pay | Admitting: Urology

## 2013-09-08 DIAGNOSIS — N135 Crossing vessel and stricture of ureter without hydronephrosis: Secondary | ICD-10-CM

## 2013-09-12 ENCOUNTER — Encounter (HOSPITAL_COMMUNITY): Payer: BC Managed Care – PPO

## 2015-05-02 IMAGING — CT CT ABD-PELV W/ CM
2 of 5 series · 15 of 46 positions shown, 17 images · IV contrast (Omnipaque 300)
Comparison: 05/03/2013

CLINICAL DATA: Abdominal pain on the right.  Recent pyeloplasty

EXAM:
CT ABDOMEN AND PELVIS WITH CONTRAST
TECHNIQUE: Multidetector CT imaging of the abdomen and pelvis was performed
using the standard protocol following bolus administration of
intravenous contrast.
CONTRAST:  100mL OMNIPAQUE IOHEXOL 300 MG/ML  SOLN

[Series 2: abd_pel_with 5.0 b40f · axial · 0.60mm/px · z∈[-476,-81]mm · 12 of 89 slices shown, 14 images]
[im 5/89  soft-tissue]
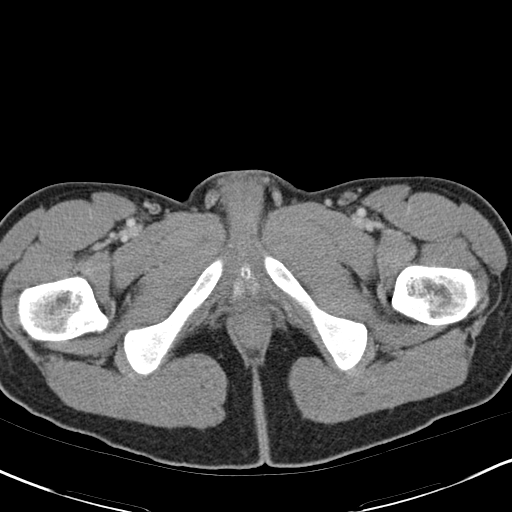
[im 5/89  bone]
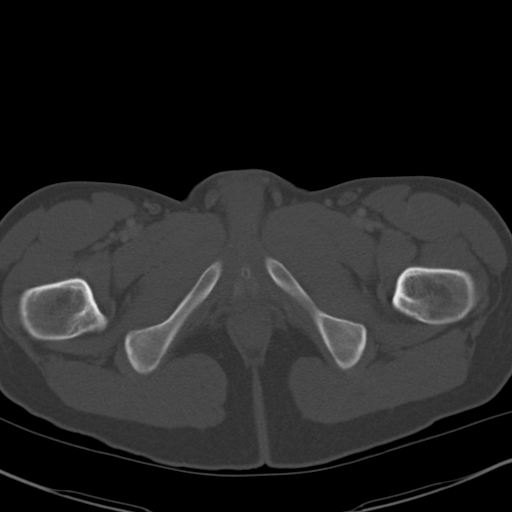
[im 15/89  soft-tissue]
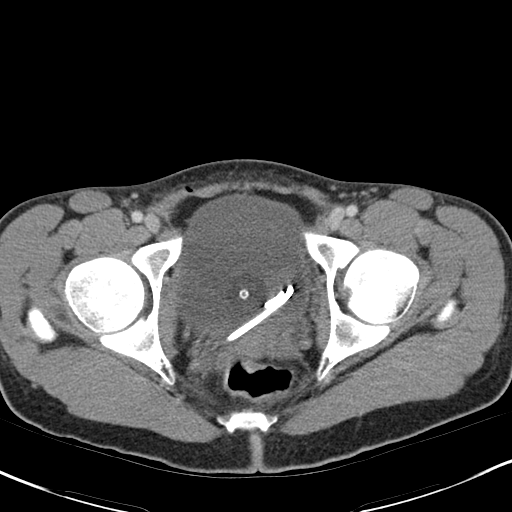
[im 20/89  soft-tissue]
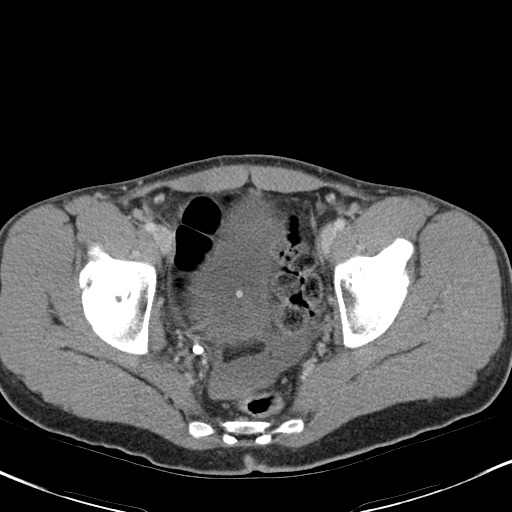
[im 25/89  soft-tissue]
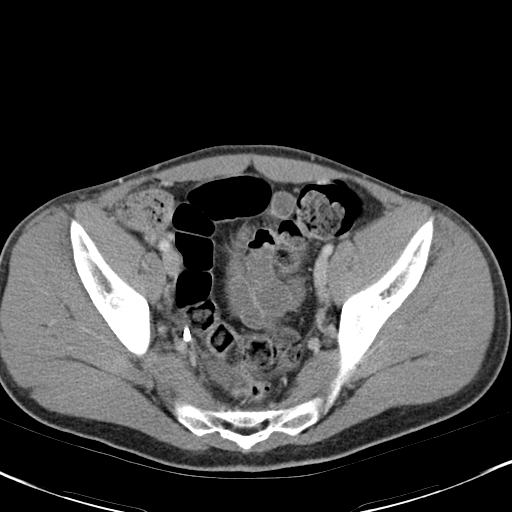
[im 35/89  soft-tissue]
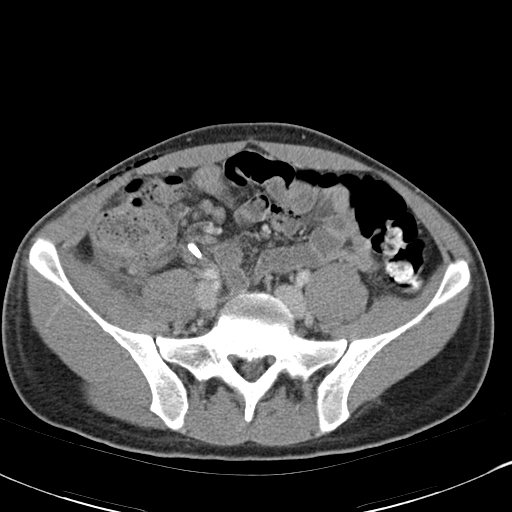
[im 40/89  soft-tissue]
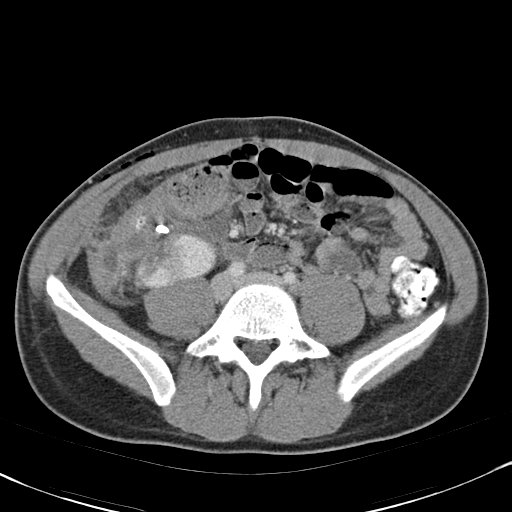
[im 49/89  soft-tissue]
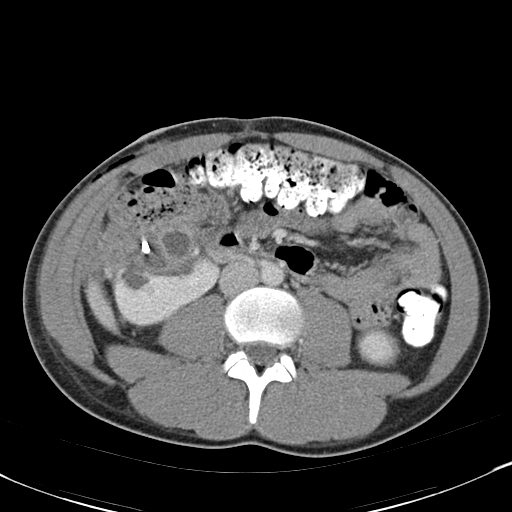
[im 54/89  soft-tissue]
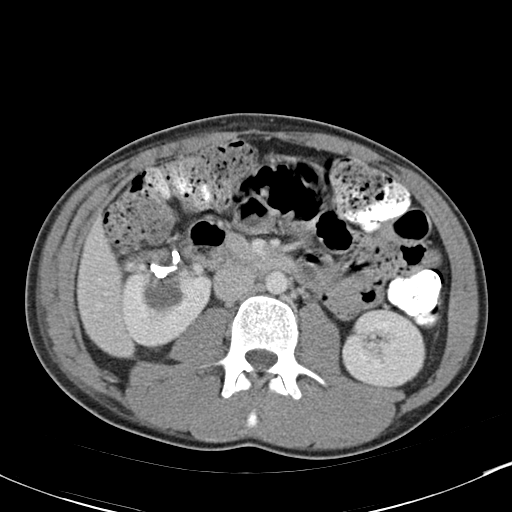
[im 64/89  soft-tissue]
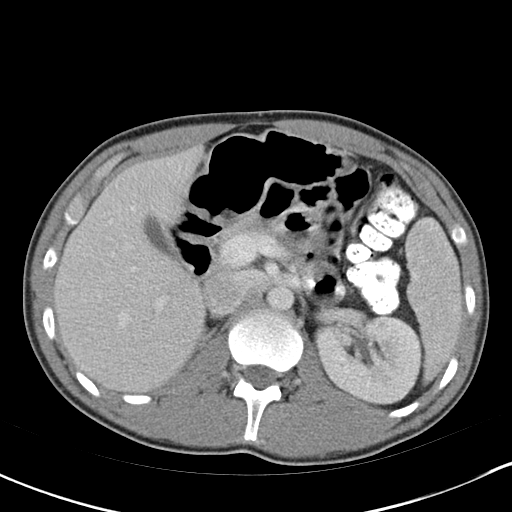
[im 64/89  bone]
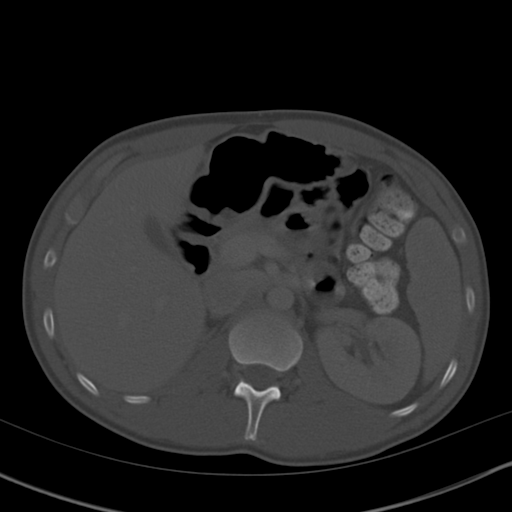
[im 69/89  soft-tissue]
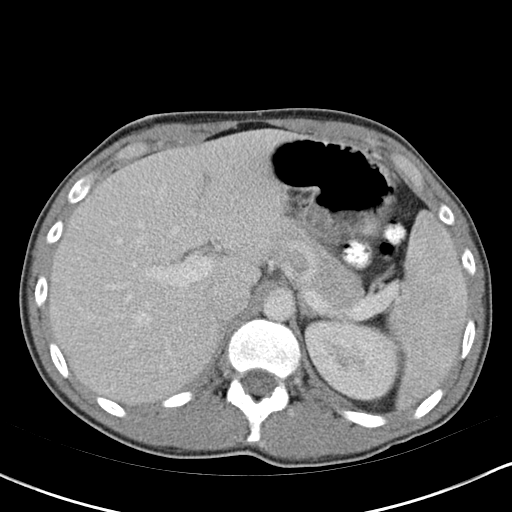
[im 74/89  soft-tissue]
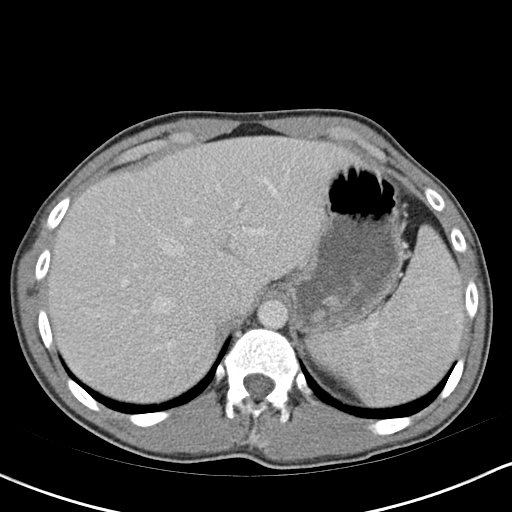
[im 84/89  soft-tissue]
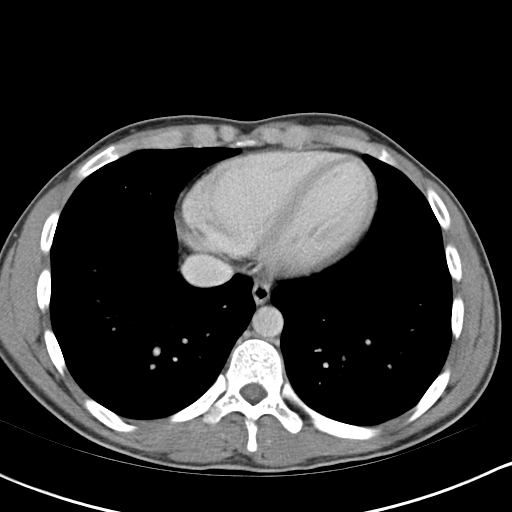

[Series 4: abd_pel_with 3.0 spo cor · coronal · 0.59mm/px · 3 of 67 slices shown]
[im 23/67  soft-tissue]
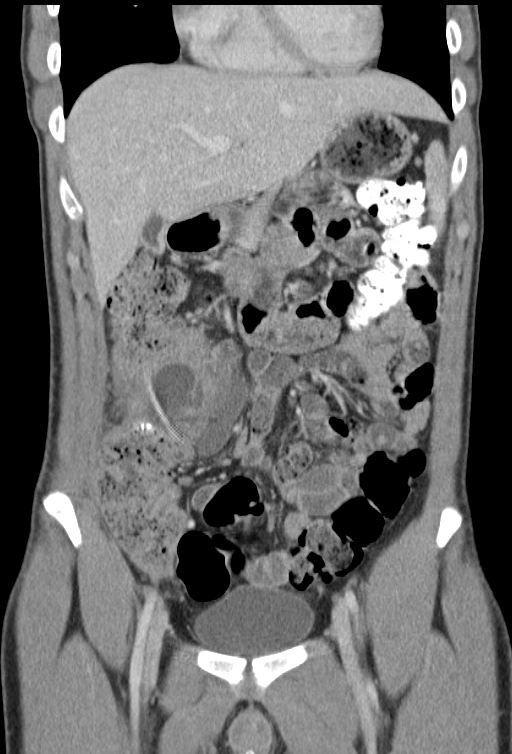
[im 30/67  soft-tissue]
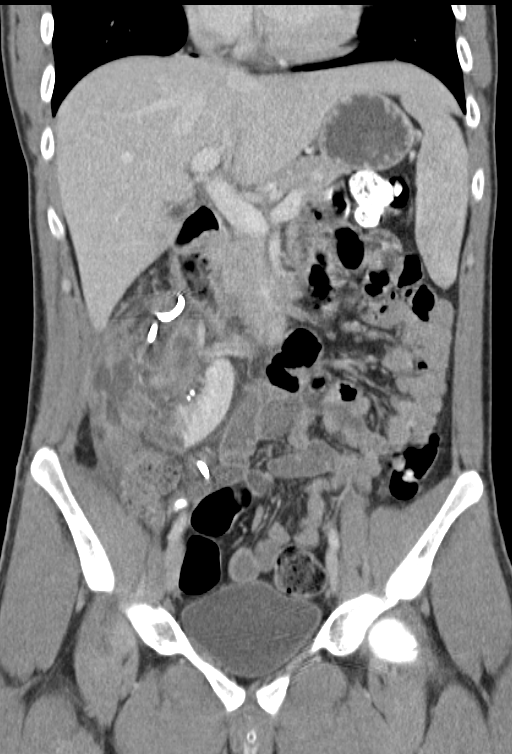
[im 37/67  soft-tissue]
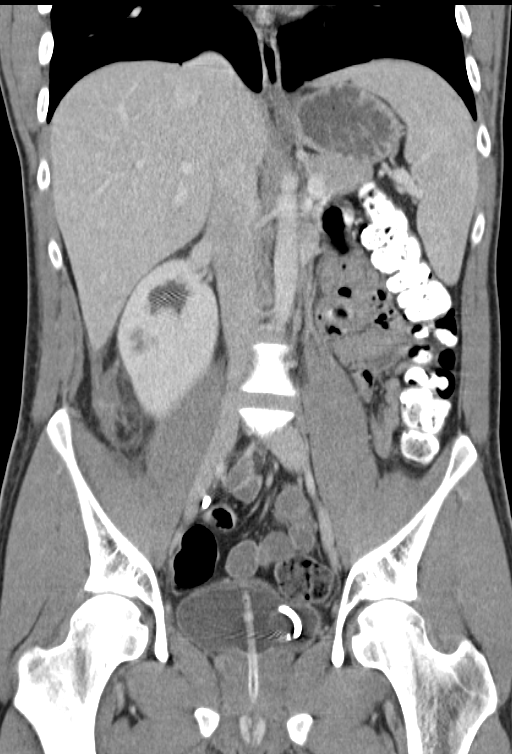

[15 of 46 positions shown; findings below may reference images not displayed]

FINDINGS: BODY WALL: No increase in subcutaneous and intra-abdominal gas in
the right abdomen.

LOWER CHEST: Unremarkable.

ABDOMEN/PELVIS:

Liver: No focal abnormality.

Biliary: No evidence of biliary obstruction or stone.

Pancreas: Unremarkable.

Spleen: Unremarkable.

Adrenals: Unremarkable.

Kidneys and ureters: Malrotated right kidney with chronic
hydronephrosis. There is a urinoma anterior to the renal pelvis
status post recent pyeloplasty, which has more defined and avidly
enhancing walls currently. The largest pocket is now 2.3 x 3.2 cm,
previously 2.4 x 1.5 cm. Although is difficult to differentiate the
collecting system from the urinoma, no definite increase in the
chronic hydronephrosis. Renal enhancement is similar to prior, with
a wedge-shaped area of hypo enhancement inferiorly which may
represent infection or remote small vessel compromise. Stones again
the noted in the lower pole right kidney. Unchanged appearance of
the left kidney, including pelviectasis. The fluid in the pelvic
recesses is slightly decreased. Residual high attenuation noted
dependently within the fluid. No evidence of aneurysm or active
extravasation. A right-sided ureteral stent is in stable position,
with upper and lower limbs fully formed.

Bladder: Foley catheter present. The bladder is mildly distended
with urine.

Reproductive: Unremarkable.

Bowel: No obstruction. Normal appendix.

Retroperitoneum: No mass or adenopathy.

Vascular: No acute abnormality.

OSSEOUS: No acute abnormalities.
IMPRESSION: 1. Increased fullness of a urinoma anterior to the right kidney,
with more defined walls. The collection is of indeterminate
sterility. Decreasing intraperitoneal fluid/urine.
2. A right ureteral stent is in good position. Despite the mild
urinoma increase, no definite increase in right-sided hydronephrosis
to suggest stent failure.

## 2015-05-04 IMAGING — US US RENAL
1 series · 14 of 25 positions shown · non-contrast
Comparison: 03/17/2013

CLINICAL DATA: Urinary retention.

EXAM:
RENAL/URINARY TRACT ULTRASOUND COMPLETE

[Series 1: us renal · 0.18mm/px · 50 acquisitions, 14 frames shown]
[im 1/50]
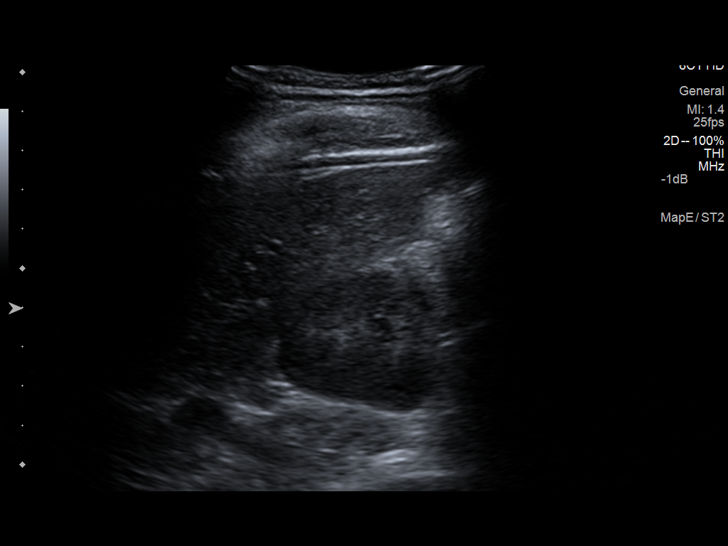
[im 5/50]
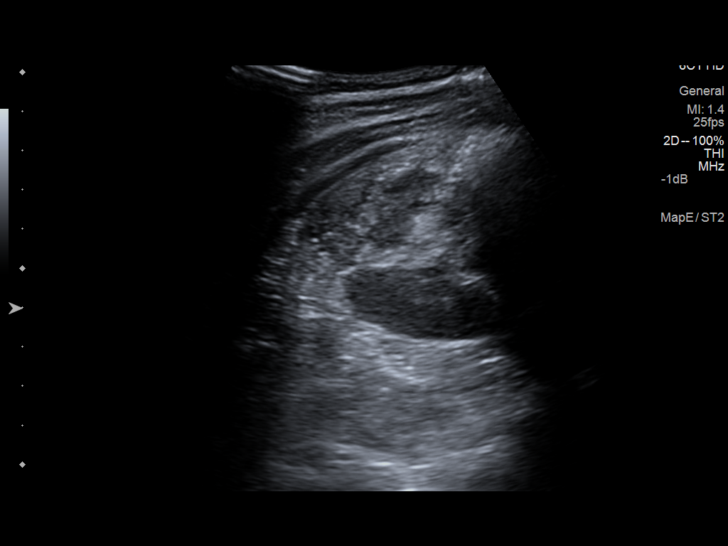
[im 9/50]
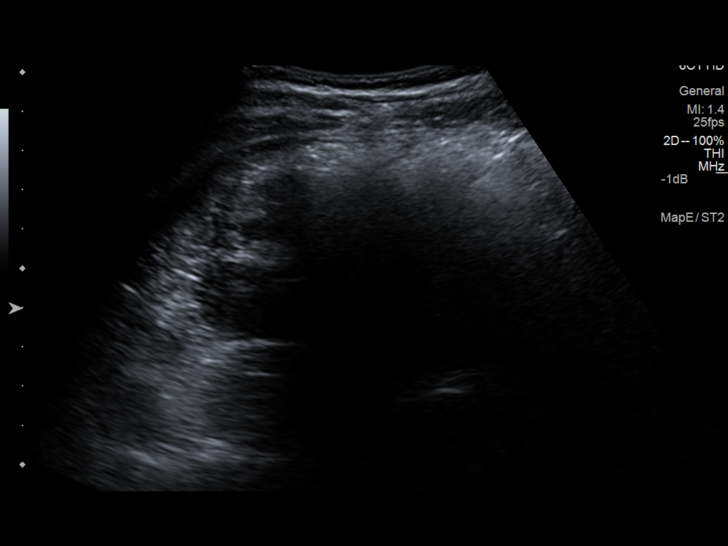
[im 13/50]
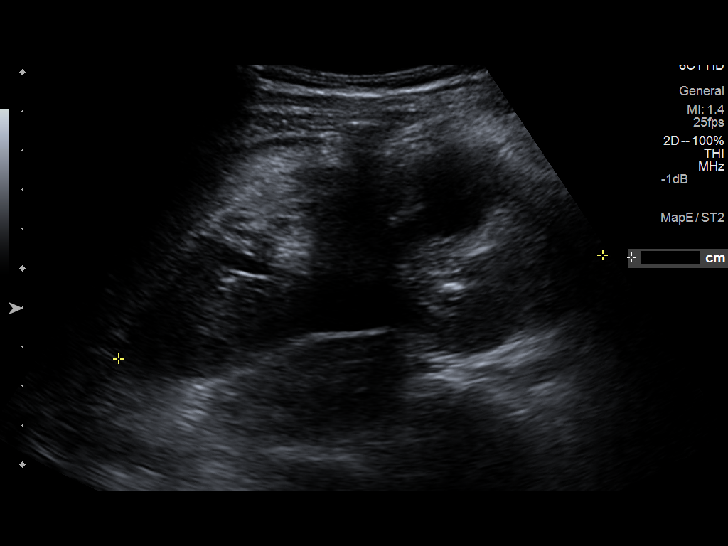
[im 17/50]
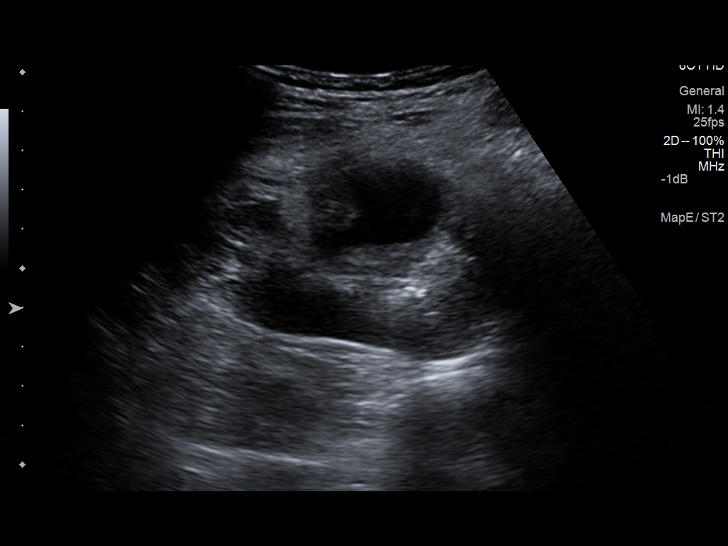
[im 19/50]
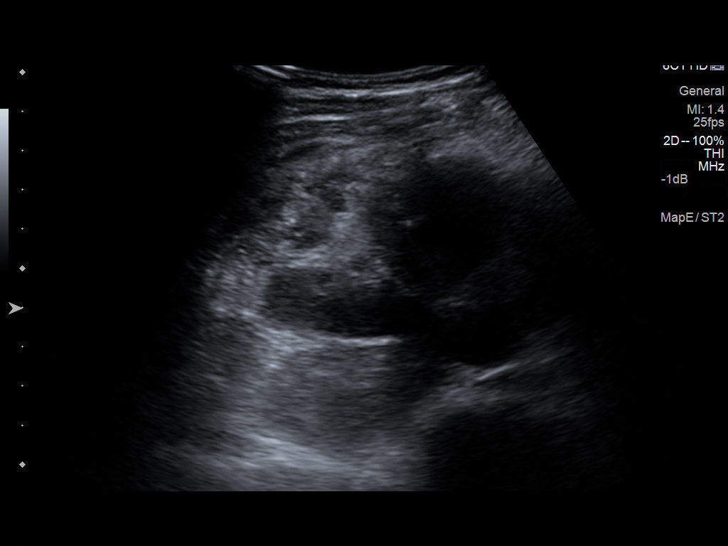
[im 23/50]
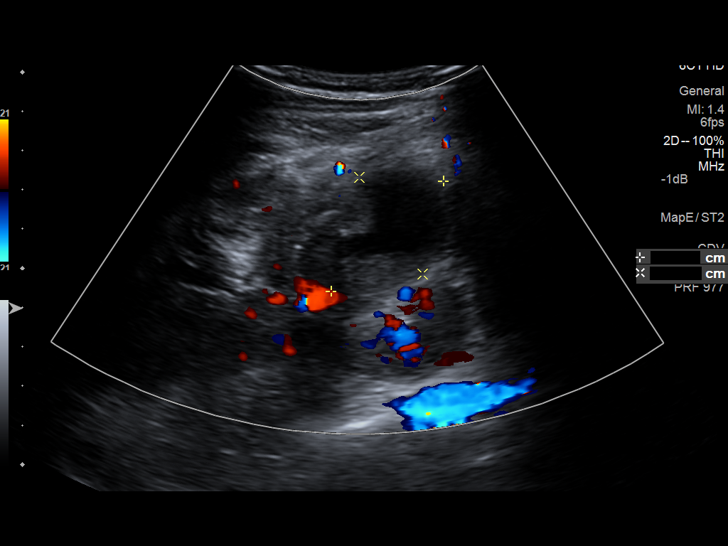
[im 27/50]
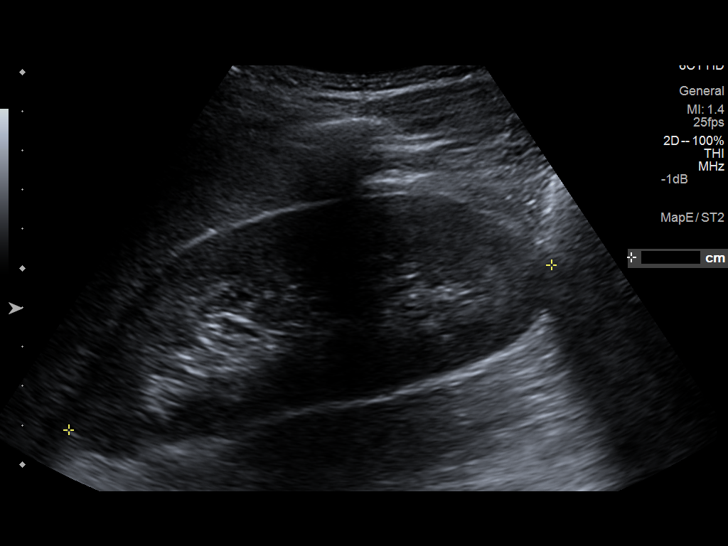
[im 31/50]
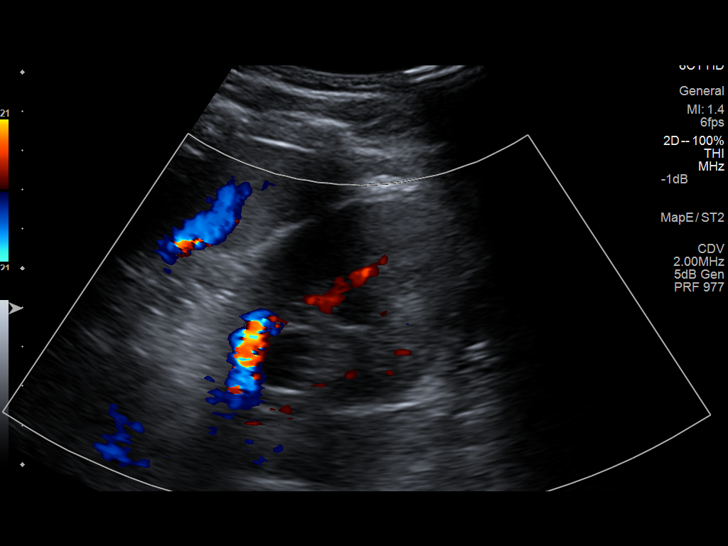
[im 33/50]
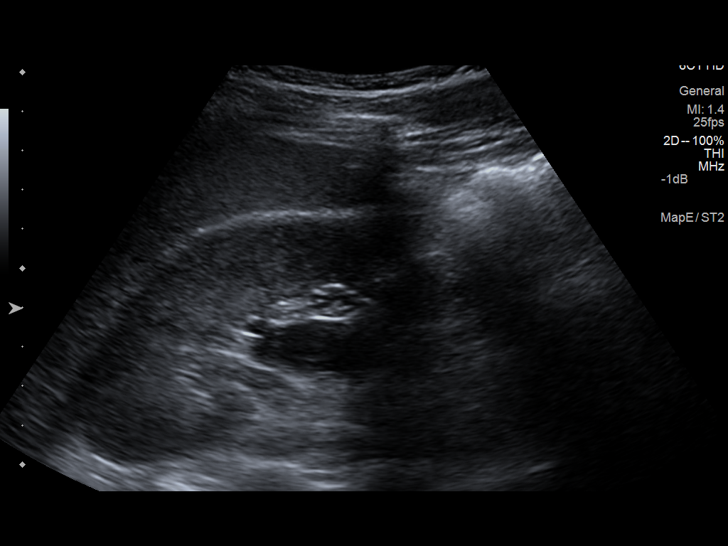
[im 37/50]
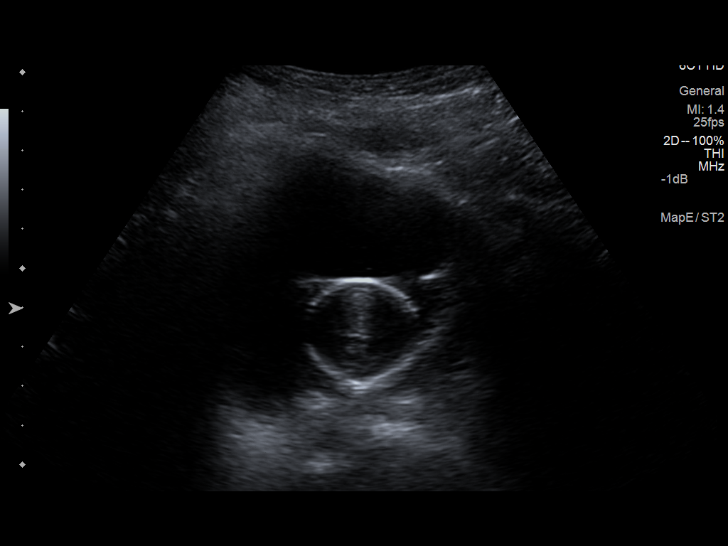
[im 41/50]
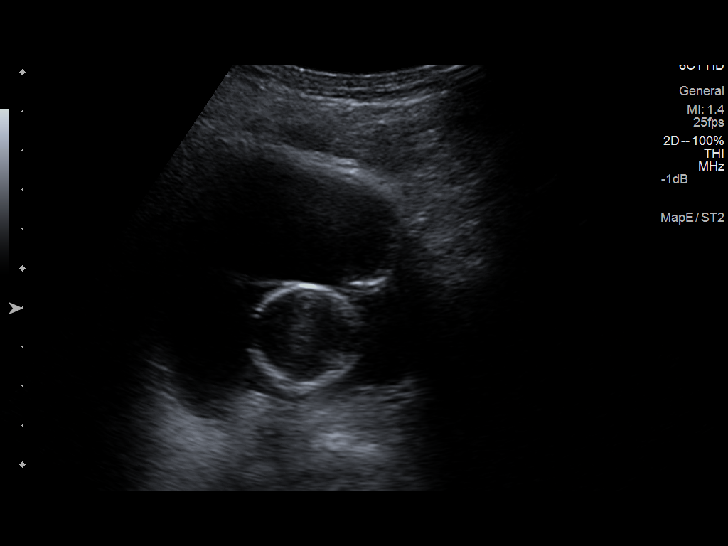
[im 45/50]
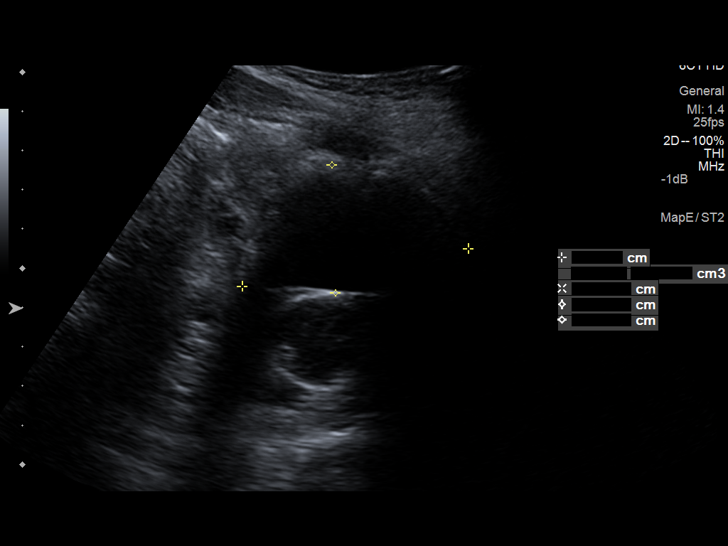
[im 50/50]
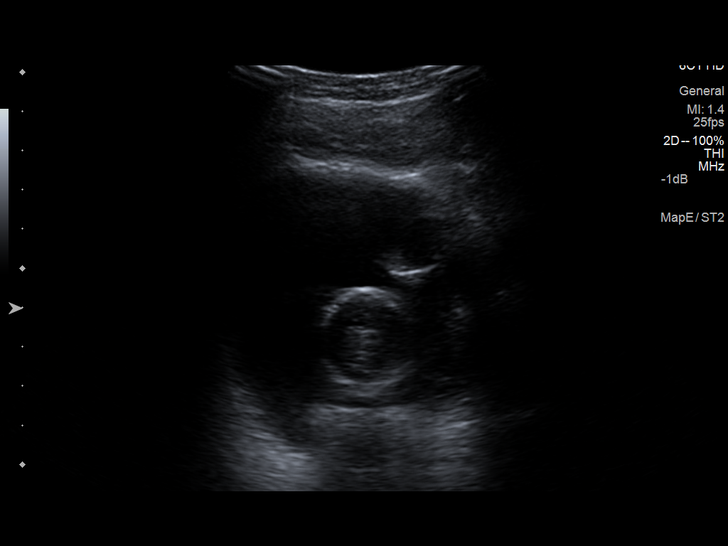

[14 of 25 positions shown; findings below may reference images not displayed]

FINDINGS: Right Kidney

Length: Malrotated right kidney. No evidence of worsening
hydronephrosis compared to CT from 2 days prior. The renal pelvis or
urinoma at the level of the hilum, 3 cm in diameter, has layering
debris. Sensitivity for detecting urinoma enlargement is limited due
to differences in technique. Renal length 13 cm.

Left Kidney

Length: Chronic pelviectasis, without caliectasis. This may
represent a mild congential UPJ obstruction. Length 13 cm.

Bladder

116 cc bladder volume. A Foley catheter is in good position.
Right-sided ureteral stent is seen within the bladder. There appears
to be mid level echoes dependently within the bladder.
IMPRESSION: 1. 116 cc volume bladder, despite good positioning of a Foley
catheter. There may be debris within the dependent bladder, which
could cause catheter obstruction.
2. Stable right hydronephrosis.
3. Debris present within the right intrarenal collecting system or
neighboring urinoma, correlate with urinalysis and infectious
symptoms.

## 2023-01-10 NOTE — Progress Notes (Unsigned)
GI Office Note    Referring Provider: Selinda Flavin, MD Primary Care Physician:  Selinda Flavin, MD  Primary Gastroenterologist: Dr. Tasia Catchings  Chief Complaint   Chief Complaint  Patient presents with   Nausea    Patient here today due to issues for the last month after eating he has epigastric pressure, nausea, then vomiting.  Patient is taking Famotidine 20 mg bid.    History of Present Illness   Steve Colon is a 27 y.o. male presenting today at the request of Selinda Flavin, MD for nausea/vomiting, epigastric pain.   Recent urgent care visit for nausea/vomiting.   Has been having a month long of N/V or regurgitation after meals. After eating he gets an epigastric discomfort and then pani and then nausea and then vomiting. Occurring with almost every meal. Is only able to tolerate snack type of meals but not a larger meal. Couple nights ago he thought his emesis had some red tinge to it but not overt. The epigastric pain/pressure continues after vomiting but nausea goes away. Urgent care told him to take famotidine BID which he has been doing as well as zofran to take. UC did not take blood work. No dysphagia. He has lost a few pounds during all this. About 5 lbs in the last month. When this first started he had just eaten a burger. Does eat lots of fried foods. Symptoms not worse nocturnally.   Ibuprofen 2 per day on a daily basis for the last couple months. Former smoker. Occasionally vaping.   Not having BM as frequent but no overt constipation.   Current Outpatient Medications  Medication Sig Dispense Refill   famotidine (PEPCID) 20 MG tablet Take 20 mg by mouth 2 (two) times daily.     ondansetron (ZOFRAN ODT) 8 MG disintegrating tablet Take 1 tablet (8 mg total) by mouth every 8 (eight) hours as needed for nausea or vomiting. 10 tablet 0   No current facility-administered medications for this visit.    Past Medical History:  Diagnosis Date   Kidney stones     Past  Surgical History:  Procedure Laterality Date   CYSTOSCOPY W/ URETERAL STENT PLACEMENT Right 04/28/2013   Procedure: CYSTOSCOPY WITH RIGHT RETROGRADE PYELOGRAM/RIGHT URETERAL STENT PLACEMENT;  Surgeon: Crecencio Mc, MD;  Location: [facility];  Service: Urology;  Laterality: Right;   EYE SURGERY  4 yrs ago   to straighte eyes   ROBOT ASSISTED PYELOPLASTY Right 04/28/2013   Procedure: ROBOTIC ASSISTED PYELOPLASTY/ RIGHT PYELOLITHOTOMY;  Surgeon: Crecencio Mc, MD;  Location: [facility];  Service: Urology;  Laterality: Right;    Family History  Problem Relation Age of Onset   GER disease Father    Gallbladder disease Father    Colon cancer Neg Hx    Colon polyps Neg Hx     Allergies as of 01/11/2023 - Review Complete 01/11/2023  Allergen Reaction Noted   Codeine Anaphylaxis and Swelling 04/30/2012   Toradol [ketorolac tromethamine] Anaphylaxis and Swelling 04/18/2013   Tramadol Nausea And Vomiting 02/13/2013   Barium-containing compounds Rash 04/24/2013    Social History   Socioeconomic History   Marital status: Married    Spouse name: Not on file   Number of children: Not on file   Years of education: Not on file   Highest education level: Not on file  Occupational History   Not on file  Tobacco Use   Smoking status: Former    Current packs/day: 0.00    Average packs/day: 0.5  packs/day for 2.0 years (1.0 ttl pk-yrs)    Types: Cigarettes    Quit date: 09/04/2021    Years since quitting: 1.3   Smokeless tobacco: Never  Vaping Use   Vaping status: Some Days  Substance and Sexual Activity   Alcohol use: No   Drug use: No   Sexual activity: Yes    Birth control/protection: None  Other Topics Concern   Not on file  Social History Narrative   Not on file   Social Determinants of Health   Financial Resource Strain: Not on file  Food Insecurity: Not on file  Transportation Needs: Not on file  Physical Activity: Not on file  Stress: Not on file  Social Connections: Not on file   Intimate Partner Violence: Not on file     Review of Systems   Gen: Denies any fever, chills, fatigue, weight loss, lack of appetite.  CV: Denies chest pain, heart palpitations, peripheral edema, syncope.  Resp: Denies shortness of breath at rest or with exertion. Denies wheezing or cough.  GI: see HPI GU : Denies urinary burning, urinary frequency, urinary hesitancy MS: Denies joint pain, muscle weakness, cramps, or limitation of movement.  Derm: Denies rash, itching, dry skin Psych: Denies depression, anxiety, memory loss, and confusion Heme: Denies bruising, bleeding, and enlarged lymph nodes.   Physical Exam   BP 130/75 (BP Location: Left Arm, Patient Position: Sitting, Cuff Size: Normal)   Pulse 66   Temp 98.6 F (37 C) (Temporal)   Ht 5\' 7"  (1.702 m)   Wt 154 lb (69.9 kg)   BMI 24.12 kg/m   General:   Alert and oriented. Pleasant and cooperative. Well-nourished and well-developed.  Head:  Normocephalic and atraumatic. Eyes:  Without icterus, sclera clear and conjunctiva pink.  Ears:  Normal auditory acuity. Mouth:  No deformity or lesions, oral mucosa pink.  Lungs:  Clear to auscultation bilaterally. No wheezes, rales, or rhonchi. No distress.  Heart:  S1, S2 present without murmurs appreciated.  Abdomen:  +BS, soft, non-distended. TTP to RUQ, LUQ, and epigastrium. No HSM noted. No guarding or rebound. No masses appreciated. Rectal:  Deferred  Msk:  Symmetrical without gross deformities. Normal posture. Extremities:  Without edema. Neurologic:  Alert and  oriented x4;  grossly normal neurologically. Skin:  Intact without significant lesions or rashes. Psych:  Alert and cooperative. Normal mood and affect.   Assessment   Steve Colon is a 27 y.o. male with a history of kidney stones presenting today for evaluation of post prandial N/V, epigastric pain  Nausea/vomiting, epigastric pain: Patient explains about 1 month of epigastric pain followed by nausea and  vomiting, sometimes regurgitation of food and sometimes just mucus.  Has had some mild weight loss of about 5-6 pounds related to this.  Has admitted to some frequent daily NSAID use for the last month which I have advised him to stop.  Symptoms consistent with acid reflux/gastritis which has not been well-controlled with famotidine twice daily.  He has attempted to take some of his wife's omeprazole at home without good relief therefore we will try pantoprazole and for now we will do twice daily dosing for at least 3-4 weeks.  Advised him he can continue to use Zofran as needed and famotidine for breakthrough.  Discussed dietary changes as well.  Pretty significantly tender to upper abdomen today on exam therefore if no improvement in 1-2 weeks we will perform CBC, CMP, lipase and also check ultrasound to rule out gallbladder and  pancreatic etiology and likely if all of this negative or unremarkable we will perform EGD.  PLAN   Pantoprazole 40 mg BID Continue Zofran 4 mg ODT as needed Famotidine as needed for breakthrough Progress report in 1-2 weeks. Avoid all NSAIDs GERD diet Will plan on labs and [place] if no improvement in 1-2 weeks. FMLA for UC visit and return to paperwork forms filled out.   Follow up in 6 weeks   Brooke Bonito, MSN, FNP-BC, AGACNP-BC [facility]

## 2023-01-11 ENCOUNTER — Encounter: Payer: Self-pay | Admitting: Gastroenterology

## 2023-01-11 ENCOUNTER — Ambulatory Visit (INDEPENDENT_AMBULATORY_CARE_PROVIDER_SITE_OTHER): Payer: Self-pay | Admitting: Gastroenterology

## 2023-01-11 VITALS — BP 130/75 | HR 66 | Temp 98.6°F | Ht 67.0 in | Wt 154.0 lb

## 2023-01-11 DIAGNOSIS — R1010 Upper abdominal pain, unspecified: Secondary | ICD-10-CM

## 2023-01-11 DIAGNOSIS — K219 Gastro-esophageal reflux disease without esophagitis: Secondary | ICD-10-CM

## 2023-01-11 DIAGNOSIS — R112 Nausea with vomiting, unspecified: Secondary | ICD-10-CM

## 2023-01-11 MED ORDER — PANTOPRAZOLE SODIUM 40 MG PO TBEC: 40.0000 mg | DELAYED_RELEASE_TABLET | Freq: Every day | ORAL | 2 refills | Status: DC

## 2023-01-11 NOTE — Patient Instructions (Addendum)
Start pantoprazole 40 mg twice daily (30 minutes prior to breakfast and dinner). You may use the famotidine for breakthrough symptoms.   Continue Zofran as needed.  Please focus on eating 4-6 more meals per day rather than 3 larger meals.  Follow a GERD diet:  Avoid fried, fatty, greasy, spicy, citrus foods. Avoid caffeine and carbonated beverages. Avoid chocolate. Try eating 4-6 small meals a day rather than 3 large meals. Do not eat within 3 hours of laying down. Prop head of bed up on wood or bricks to create a 6 inch incline.  Avoid any NSAIDs (ibuprofen, Aleve, Advil, Excedrin Migraine) and any aspirin powder such as BC or Goody powders.  Please call me with a progress report in 1-2 weeks or send message via MyChart to let me know if you had any improvement or worsening of symptoms.  If you have we will plan on labs and possibly ultrasound to evaluate your upper abdominal pain with possible consideration for upper endoscopy as well.  Follow up in 6 weeks.    It was a pleasure to see you today. I want to create trusting relationships with patients. If you receive a survey regarding your visit,  I greatly appreciate you taking time to fill this out on paper or through your MyChart. I value your feedback.  Brooke Bonito, MSN, FNP-BC, AGACNP-BC [facility]

## 2023-01-22 ENCOUNTER — Telehealth: Payer: Self-pay | Admitting: *Deleted

## 2023-01-22 NOTE — Telephone Encounter (Signed)
Pt called and states he had a flare up this pass weekend and could not go to work. He states that the pantoprazole is not working for him. Also his [employer] papers were not filled out correctly. They  need to be extended for a year. I informed him I knew nothing about this and would check with you and have you get back to him.

## 2023-01-23 ENCOUNTER — Encounter: Payer: Self-pay | Admitting: *Deleted

## 2023-01-23 ENCOUNTER — Other Ambulatory Visit: Payer: Self-pay | Admitting: Gastroenterology

## 2023-01-23 ENCOUNTER — Other Ambulatory Visit: Payer: Self-pay | Admitting: *Deleted

## 2023-01-23 DIAGNOSIS — K219 Gastro-esophageal reflux disease without esophagitis: Secondary | ICD-10-CM

## 2023-01-23 DIAGNOSIS — R112 Nausea with vomiting, unspecified: Secondary | ICD-10-CM

## 2023-01-23 MED ORDER — ESOMEPRAZOLE MAGNESIUM 40 MG PO CPDR: 40.0000 mg | DELAYED_RELEASE_CAPSULE | Freq: Two times a day (BID) | ORAL | 1 refills | Status: DC

## 2023-01-23 MED ORDER — SUCRALFATE 1 G PO TABS: 1.0000 g | ORAL_TABLET | Freq: Three times a day (TID) | ORAL | 0 refills | Status: DC

## 2023-01-23 NOTE — Telephone Encounter (Signed)
Sent pt a MyChart message

## 2023-01-23 NOTE — Telephone Encounter (Signed)
Spoke to pt, he informed me that he is out of Zofran and pantoprazole is not working. He states she still has a lot of phlegm in his throat and his stomach is burning. He is agreeable to try Nexium. He said he would try anything if it would help with the burning. Informed to go to the hospital to have labs done when he has ultrasound done. FMLA papers are for a year and it needs to be for how many days a month he can miss do too a flare up. How long are flare ups too last?   Labs entered into Epic.

## 2023-01-24 ENCOUNTER — Encounter: Payer: Self-pay | Admitting: *Deleted

## 2023-01-31 ENCOUNTER — Other Ambulatory Visit: Payer: Self-pay | Admitting: Gastroenterology

## 2023-01-31 MED ORDER — ONDANSETRON 8 MG PO TBDP: 8.0000 mg | ORAL_TABLET | Freq: Three times a day (TID) | ORAL | 1 refills | Status: DC | PRN

## 2023-02-20 NOTE — Telephone Encounter (Signed)
He will need RUQ ultrasound.  He also needs to have blood work completed that [employer] ordered on 8/6. Please remind him of this when you speak to him.

## 2023-02-20 NOTE — Telephone Encounter (Signed)
I seen in Courtney's note she said we would scheduled an ultrasound if no improvement in 1-2 weeks. I'm not sure what type of ultrasound she is wanting. Please advise. Thank you

## 2023-02-21 ENCOUNTER — Other Ambulatory Visit: Payer: Self-pay | Admitting: *Deleted

## 2023-02-21 DIAGNOSIS — R112 Nausea with vomiting, unspecified: Secondary | ICD-10-CM

## 2023-02-21 DIAGNOSIS — R1010 Upper abdominal pain, unspecified: Secondary | ICD-10-CM

## 2023-02-21 NOTE — Addendum Note (Signed)
Addended by: Elinor Dodge on: 02/21/2023 07:56 AM   Modules accepted: Orders

## 2023-02-26 NOTE — Progress Notes (Deleted)
GI Office Note    Referring Provider: Selinda Flavin, MD Primary Care Physician:  Patient, No Pcp Per Primary Gastroenterologist: ***  Date:  02/26/2023  ID:  Steve Colon, DOB 1996-04-13, MRN [id number]   Chief Complaint   No chief complaint on file.    History of Present Illness  Steve Colon is a 27 y.o. male with a history of kidney stones presenting today with complaint of ongoing nausea/vomiting and epigastric pain.  Urgent care visit in early July for nausea and vomiting.  Initial office visit 01/11/2023.  Patient had a month-long of nausea/vomiting or regurgitation after meals.  This is associated with epigastric discomfort as well as pain.  This occurs with almost every meal.  Has only been able to tolerate snack types of meals, not larger meals.  Continues to have epigastric pain/pressure even after vomiting but nausea will subside.  Had not had any improvement with famotidine twice daily or low-dose Zofran.  Denies any dysphagia.  Given his decreased p.o. intake he did report some weight loss.  Had reported ibuprofen use, to tablets daily for couple of months.  Currently vaping, former smoker.  Advised to avoid all NSAIDs, follow GERD diet, start pantoprazole 40 mg twice daily and continue famotidine as needed for breakthrough.  Zofran refilled.  FMLA paperwork filled out.  Advised to complete labs and ultrasound if no improvement in 1-2 weeks.  Today:    Current Outpatient Medications  Medication Sig Dispense Refill   esomeprazole (NEXIUM) 40 MG capsule Take 1 capsule (40 mg total) by mouth 2 (two) times daily before a meal. 60 capsule 1   famotidine (PEPCID) 20 MG tablet Take 20 mg by mouth 2 (two) times daily.     ondansetron (ZOFRAN ODT) 8 MG disintegrating tablet Take 1 tablet (8 mg total) by mouth every 8 (eight) hours as needed for nausea or vomiting. 10 tablet 1   sucralfate (CARAFATE) 1 g tablet Take 1 tablet (1 g total) by mouth 4 (four) times daily -  with  meals and at bedtime for 14 days. May dissolve 1 tablet in 1-2 ounces of water and drink as a slurry. 56 tablet 0   No current facility-administered medications for this visit.    Past Medical History:  Diagnosis Date   Kidney stones     Past Surgical History:  Procedure Laterality Date   CYSTOSCOPY W/ URETERAL STENT PLACEMENT Right 04/28/2013   Procedure: CYSTOSCOPY WITH RIGHT RETROGRADE PYELOGRAM/RIGHT URETERAL STENT PLACEMENT;  Surgeon: Crecencio Mc, MD;  Location: [facility];  Service: Urology;  Laterality: Right;   EYE SURGERY  4 yrs ago   to straighte eyes   ROBOT ASSISTED PYELOPLASTY Right 04/28/2013   Procedure: ROBOTIC ASSISTED PYELOPLASTY/ RIGHT PYELOLITHOTOMY;  Surgeon: Crecencio Mc, MD;  Location: [facility];  Service: Urology;  Laterality: Right;    Family History  Problem Relation Age of Onset   GER disease Father    Gallbladder disease Father    Colon cancer Neg Hx    Colon polyps Neg Hx     Allergies as of 02/27/2023 - Review Complete 01/11/2023  Allergen Reaction Noted   Codeine Anaphylaxis and Swelling 04/30/2012   Toradol [ketorolac tromethamine] Anaphylaxis and Swelling 04/18/2013   Tramadol Nausea And Vomiting 02/13/2013   Barium-containing compounds Rash 04/24/2013    Social History   Socioeconomic History   Marital status: Married    Spouse name: Not on file   Number of children: Not on file   Years of  education: Not on file   Highest education level: Not on file  Occupational History   Not on file  Tobacco Use   Smoking status: Former    Current packs/day: 0.00    Average packs/day: 0.5 packs/day for 2.0 years (1.0 ttl pk-yrs)    Types: Cigarettes    Quit date: 09/04/2021    Years since quitting: 1.4   Smokeless tobacco: Never  Vaping Use   Vaping status: Some Days  Substance and Sexual Activity   Alcohol use: No   Drug use: No   Sexual activity: Yes    Birth control/protection: None  Other Topics Concern   Not on file  Social History  Narrative   Not on file   Social Determinants of Health   Financial Resource Strain: Not on file  Food Insecurity: Not on file  Transportation Needs: Not on file  Physical Activity: Not on file  Stress: Not on file  Social Connections: Not on file     Review of Systems   Gen: Denies fever, chills, anorexia. Denies fatigue, weakness, weight loss.  CV: Denies chest pain, palpitations, syncope, peripheral edema, and claudication. Resp: Denies dyspnea at rest, cough, wheezing, coughing up blood, and pleurisy. GI: See HPI Derm: Denies rash, itching, dry skin Psych: Denies depression, anxiety, memory loss, confusion. No homicidal or suicidal ideation.  Heme: Denies bruising, bleeding, and enlarged lymph nodes.   Physical Exam   There were no vitals taken for this visit.  General:   Alert and oriented. No distress noted. Pleasant and cooperative.  Head:  Normocephalic and atraumatic. Eyes:  Conjuctiva clear without scleral icterus. Mouth:  Oral mucosa pink and moist. Good dentition. No lesions. Lungs:  Clear to auscultation bilaterally. No wheezes, rales, or rhonchi. No distress.  Heart:  S1, S2 present without murmurs appreciated.  Abdomen:  +BS, soft, non-tender and non-distended. No rebound or guarding. No HSM or masses noted. Rectal: *** Msk:  Symmetrical without gross deformities. Normal posture. Extremities:  Without edema. Neurologic:  Alert and  oriented x4 Psych:  Alert and cooperative. Normal mood and affect.   Assessment  Steve Colon is a 27 y.o. male with a history of kidney stones presenting today for follow-up regarding postprandial nausea/vomiting and epigastric pain.  Nausea/vomiting, epigastric pain:  PLAN   *** Follow up RUQ [place] this morning Continue pantoprazole BID Continue zofran 8mg  as needed Complete lab work today - CBC, CMP, Lipase EGD? Carafate?    Brooke Bonito, MSN, FNP-BC, AGACNP-BC [facility]

## 2023-02-27 ENCOUNTER — Ambulatory Visit (HOSPITAL_COMMUNITY): Payer: BC Managed Care – PPO | Attending: Gastroenterology

## 2023-02-27 ENCOUNTER — Other Ambulatory Visit: Payer: Self-pay | Admitting: Gastroenterology

## 2023-02-27 ENCOUNTER — Ambulatory Visit: Payer: BC Managed Care – PPO | Admitting: Gastroenterology

## 2023-02-27 MED ORDER — ONDANSETRON 8 MG PO TBDP: 8.0000 mg | ORAL_TABLET | Freq: Three times a day (TID) | ORAL | 1 refills | Status: DC | PRN

## 2023-03-18 NOTE — Progress Notes (Deleted)
GI Office Note    Referring Provider: No ref. provider found Primary Care Physician:  Patient, No Pcp Per Primary Gastroenterologist: ***  Date:  03/18/2023  ID:  Steve Colon, DOB [birth year], MRN [id number]   Chief Complaint   No chief complaint on file.  History of Present Illness  Steve Colon is a 27 y.o. male with a history of kidney stones presenting today with complaint of ongoing nausea/vomiting and epigastric pain.   Urgent care visit in early July for nausea and vomiting.   Initial office visit 01/11/2023.  Patient had a month-long of nausea/vomiting or regurgitation after meals.  This is associated with epigastric discomfort as well as pain.  This occurs with almost every meal.  Has only been able to tolerate snack types of meals, not larger meals.  Continues to have epigastric pain/pressure even after vomiting but nausea will subside.  Had not had any improvement with famotidine twice daily or low-dose Zofran.  Denies any dysphagia.  Given his decreased p.o. intake he did report some weight loss.  Had reported ibuprofen use, to tablets daily for couple of months.  Currently vaping, former smoker.  Advised to avoid all NSAIDs, follow GERD diet, start pantoprazole 40 mg twice daily and continue famotidine as needed for breakthrough.  Zofran refilled.  FMLA paperwork filled out.  Advised to complete labs and ultrasound if no improvement in 1-2 weeks.   Scheduled for [place] and OV on 9/10 - patient did not show for [place] and rescheduled OV due to sick with cough.    Today:    Current Outpatient Medications  Medication Sig Dispense Refill   esomeprazole (NEXIUM) 40 MG capsule Take 1 capsule (40 mg total) by mouth 2 (two) times daily before a meal. 60 capsule 1   famotidine (PEPCID) 20 MG tablet Take 20 mg by mouth 2 (two) times daily.     ondansetron (ZOFRAN ODT) 8 MG disintegrating tablet Take 1 tablet (8 mg total) by mouth every 8 (eight) hours as needed for nausea or  vomiting. 20 tablet 1   sucralfate (CARAFATE) 1 g tablet Take 1 tablet (1 g total) by mouth 4 (four) times daily -  with meals and at bedtime for 14 days. May dissolve 1 tablet in 1-2 ounces of water and drink as a slurry. 56 tablet 0   No current facility-administered medications for this visit.    Past Medical History:  Diagnosis Date   Kidney stones     Past Surgical History:  Procedure Laterality Date   CYSTOSCOPY W/ URETERAL STENT PLACEMENT Right 04/28/2013   Procedure: CYSTOSCOPY WITH RIGHT RETROGRADE PYELOGRAM/RIGHT URETERAL STENT PLACEMENT;  Surgeon: Crecencio Mc, MD;  Location: [facility];  Service: Urology;  Laterality: Right;   EYE SURGERY  4 yrs ago   to straighte eyes   ROBOT ASSISTED PYELOPLASTY Right 04/28/2013   Procedure: ROBOTIC ASSISTED PYELOPLASTY/ RIGHT PYELOLITHOTOMY;  Surgeon: Crecencio Mc, MD;  Location: [facility];  Service: Urology;  Laterality: Right;    Family History  Problem Relation Age of Onset   GER disease Father    Gallbladder disease Father    Colon cancer Neg Hx    Colon polyps Neg Hx     Allergies as of 03/20/2023 - Review Complete 01/11/2023  Allergen Reaction Noted   Codeine Anaphylaxis and Swelling 04/30/2012   Toradol [ketorolac tromethamine] Anaphylaxis and Swelling 04/18/2013   Tramadol Nausea And Vomiting 02/13/2013   Barium-containing compounds Rash 04/24/2013    Social History  Socioeconomic History   Marital status: Married    Spouse name: Not on file   Number of children: Not on file   Years of education: Not on file   Highest education level: Not on file  Occupational History   Not on file  Tobacco Use   Smoking status: Former    Current packs/day: 0.00    Average packs/day: 0.5 packs/day for 2.0 years (1.0 ttl pk-yrs)    Types: Cigarettes    Quit date: 09/04/2021    Years since quitting: 1.5   Smokeless tobacco: Never  Vaping Use   Vaping status: Some Days  Substance and Sexual Activity   Alcohol use: No   Drug use: No    Sexual activity: Yes    Birth control/protection: None  Other Topics Concern   Not on file  Social History Narrative   Not on file   Social Determinants of Health   Financial Resource Strain: Not on file  Food Insecurity: Not on file  Transportation Needs: Not on file  Physical Activity: Not on file  Stress: Not on file  Social Connections: Not on file     Review of Systems   Gen: Denies fever, chills, anorexia. Denies fatigue, weakness, weight loss.  CV: Denies chest pain, palpitations, syncope, peripheral edema, and claudication. Resp: Denies dyspnea at rest, cough, wheezing, coughing up blood, and pleurisy. GI: See HPI Derm: Denies rash, itching, dry skin Psych: Denies depression, anxiety, memory loss, confusion. No homicidal or suicidal ideation.  Heme: Denies bruising, bleeding, and enlarged lymph nodes.   Physical Exam   There were no vitals taken for this visit.  General:   Alert and oriented. No distress noted. Pleasant and cooperative.  Head:  Normocephalic and atraumatic. Eyes:  Conjuctiva clear without scleral icterus. Mouth:  Oral mucosa pink and moist. Good dentition. No lesions. Lungs:  Clear to auscultation bilaterally. No wheezes, rales, or rhonchi. No distress.  Heart:  S1, S2 present without murmurs appreciated.  Abdomen:  +BS, soft, non-tender and non-distended. No rebound or guarding. No HSM or masses noted. Rectal: *** Msk:  Symmetrical without gross deformities. Normal posture. Extremities:  Without edema. Neurologic:  Alert and  oriented x4 Psych:  Alert and cooperative. Normal mood and affect.   Assessment  Steve Colon is a 27 y.o. male with a history of kidney stones presenting today for follow-up regarding postprandial nausea/vomiting and epigastric pain.   Nausea/vomiting, epigastric pain:    PLAN   *** Reschedule [place] Continue pantoprazole 40 mg BID Continue zofran 8mg  as needed Complete lab work today - CBC, CMP,  Lipase EGD? Carafate?    Brooke Bonito, MSN, FNP-BC, AGACNP-BC [facility]

## 2023-03-20 ENCOUNTER — Ambulatory Visit: Payer: BC Managed Care – PPO | Admitting: Gastroenterology

## 2023-03-21 ENCOUNTER — Encounter: Payer: Self-pay | Admitting: Gastroenterology

## 2023-05-21 NOTE — Addendum Note (Signed)
Addended by: Armstead Peaks on: 05/21/2023 02:20 PM   Modules accepted: Orders

## 2023-05-23 ENCOUNTER — Ambulatory Visit (HOSPITAL_COMMUNITY)
Admission: RE | Admit: 2023-05-23 | Discharge: 2023-05-23 | Disposition: A | Discharge status: Home / Self Care | Payer: BC Managed Care – PPO | Source: Ambulatory Visit | Attending: Gastroenterology | Admitting: Gastroenterology

## 2023-05-23 DIAGNOSIS — R112 Nausea with vomiting, unspecified: Secondary | ICD-10-CM | POA: Insufficient documentation

## 2023-05-23 DIAGNOSIS — R1010 Upper abdominal pain, unspecified: Secondary | ICD-10-CM | POA: Diagnosis present

## 2023-05-24 ENCOUNTER — Telehealth: Payer: Self-pay | Admitting: *Deleted

## 2023-05-24 ENCOUNTER — Encounter: Payer: Self-pay | Admitting: *Deleted

## 2023-05-24 ENCOUNTER — Encounter: Payer: Self-pay | Admitting: Gastroenterology

## 2023-05-24 ENCOUNTER — Telehealth: Payer: BC Managed Care – PPO | Admitting: Gastroenterology

## 2023-05-24 VITALS — Ht 67.0 in | Wt 155.0 lb

## 2023-05-24 DIAGNOSIS — R1010 Upper abdominal pain, unspecified: Secondary | ICD-10-CM

## 2023-05-24 DIAGNOSIS — R1013 Epigastric pain: Secondary | ICD-10-CM | POA: Diagnosis not present

## 2023-05-24 DIAGNOSIS — Z0289 Encounter for other administrative examinations: Secondary | ICD-10-CM

## 2023-05-24 DIAGNOSIS — K219 Gastro-esophageal reflux disease without esophagitis: Secondary | ICD-10-CM | POA: Diagnosis not present

## 2023-05-24 DIAGNOSIS — R112 Nausea with vomiting, unspecified: Secondary | ICD-10-CM

## 2023-05-24 MED ORDER — ONDANSETRON 8 MG PO TBDP: 8.0000 mg | ORAL_TABLET | Freq: Three times a day (TID) | ORAL | 1 refills | Status: DC | PRN

## 2023-05-24 MED ORDER — SUCRALFATE 1 G PO TABS: 1.0000 g | ORAL_TABLET | Freq: Three times a day (TID) | ORAL | 0 refills | Status: DC

## 2023-05-24 NOTE — Progress Notes (Signed)
Primary Care Physician:  Patient, No Pcp Per  Primary Gastroenterologist: Dr. Tasia Catchings  Patient Location: Home Reason for Visit: Follow up  Persons present on the virtual encounter, with roles: Patient - Steve Colon; Provider - Brooke Bonito, NP   Total time (minutes) spent on medical discussion: 11 minutes  Virtual Visit Encounter Note Visit is conducted virtually and was requested by patient.   I connected with Steve Colon on 05/24/23 at  8:30 AM EST by video and verified that I am speaking with the correct person using two identifiers.   I discussed the limitations, risks, security and privacy concerns of performing an evaluation and management service by video and the availability of in person appointments. I also discussed with the patient that there may be a patient responsible charge related to this service. The patient expressed understanding and agreed to proceed.  Chief Complaint  Patient presents with   Follow-up    Follow up. Still having some nausea and vomiting. Stomach is burning.      History of Present Illness: Steve Colon is a 27 y.o. male with a history of kidney stones presenting for virtual visit today to follow up on nausea/vomiting and discuss further evaluation.   Urgent care visit in early July for nausea and vomiting.   Initial office visit 01/11/2023.  Patient had a month-long of nausea/vomiting or regurgitation after meals.  This is associated with epigastric discomfort as well as pain.  This occurs with almost every meal.  Has only been able to tolerate snack types of meals, not larger meals.  Continues to have epigastric pain/pressure even after vomiting but nausea will subside.  Had not had any improvement with famotidine twice daily or low-dose Zofran.  Denies any dysphagia.  Given his decreased p.o. intake he did report some weight loss.  Had reported ibuprofen use, to tablets daily for couple of months.  Currently vaping, former smoker.   Advised to avoid all NSAIDs, follow GERD diet, start pantoprazole 40 mg twice daily and continue famotidine as needed for breakthrough.  Zofran refilled.  FMLA paperwork filled out.  Advised to complete labs and ultrasound if no improvement in 1-2 weeks.  RUQ [place] 05/23/23: -CBD 3.66mm - No cholelithiasis or choledocholithiasis  Today:  He reports still waking up in the mornings dry heaving and vomiting. Having stomach on fire after eating anything - nothing specific. If he eats a large meal his stomach starts hurting within an hour and the next morning he will dry heave and then vomit up some stomach acid. Has been taking the Nexium twice daily. No marijuana or CBD use. Avoiding NSAIDs. Very rare alcohol use.  Intake still has been poor.   No lower GI complaints today.   Has been Vaping - flavored nicotene. Quit smoking March 2023 and has been using vape ever since.    Medications No outpatient medications have been marked as taking for the 05/24/23 encounter (Video Visit) with Aida Raider, NP.     History Past Medical History:  Diagnosis Date   Kidney stones     Past Surgical History:  Procedure Laterality Date   CYSTOSCOPY W/ URETERAL STENT PLACEMENT Right 04/28/2013   Procedure: CYSTOSCOPY WITH RIGHT RETROGRADE PYELOGRAM/RIGHT URETERAL STENT PLACEMENT;  Surgeon: Crecencio Mc, MD;  Location: [facility];  Service: Urology;  Laterality: Right;   EYE SURGERY  4 yrs ago   to straighte eyes   ROBOT ASSISTED PYELOPLASTY Right 04/28/2013   Procedure: ROBOTIC ASSISTED PYELOPLASTY/  RIGHT PYELOLITHOTOMY;  Surgeon: Crecencio Mc, MD;  Location: [facility];  Service: Urology;  Laterality: Right;    Family History  Problem Relation Age of Onset   GER disease Father    Gallbladder disease Father    Colon cancer Neg Hx    Colon polyps Neg Hx     Social History   Socioeconomic History   Marital status: Married    Spouse name: Not on file   Number of children: Not on file   Years of education:  Not on file   Highest education level: Not on file  Occupational History   Not on file  Tobacco Use   Smoking status: Former    Current packs/day: 0.00    Average packs/day: 0.5 packs/day for 2.0 years (1.0 ttl pk-yrs)    Types: Cigarettes    Quit date: 09/04/2021    Years since quitting: 1.7   Smokeless tobacco: Never  Vaping Use   Vaping status: Some Days  Substance and Sexual Activity   Alcohol use: No   Drug use: No   Sexual activity: Yes    Birth control/protection: None  Other Topics Concern   Not on file  Social History Narrative   Not on file   Social Determinants of Health   Financial Resource Strain: Not on file  Food Insecurity: Not on file  Transportation Needs: Not on file  Physical Activity: Not on file  Stress: Not on file  Social Connections: Not on file     Review of Systems: Gen: Denies fever, chills, anorexia. Denies fatigue, weakness, weight loss.  CV: Denies chest pain, palpitations, syncope, peripheral edema, and claudication. Resp: Denies dyspnea at rest, cough, wheezing, coughing up blood, and pleurisy. GI: see HPI Derm: Denies rash, itching, dry skin Psych: Denies depression, anxiety, memory loss, confusion. No homicidal or suicidal ideation.  Heme: Denies bruising, bleeding, and enlarged lymph nodes.  Observations/Objective: No distress. Alert and oriented. Pleasant. Well nourished. Normal mood and affect. Unable to perform complete physical exam due to video encounter.   Assessment:  Nausea/vomiting, GERD, epigastric burning: -Symptoms remain about the same -Maintained on Nexium 40 mg twice daily, has only use Carafate as needed but on a regular basis. -Advised 2 weeks of Carafate and then to use as needed while continuing PPI -Zofran refilled today. -Differentials include H. pylori, esophagitis, gastritis, duodenitis, peptic ulcer disease, biliary dyskinesia, gastroparesis (although less likely), medication side effect, functional  nausea and vomiting. -Will proceed with upper endoscopy for further evaluation.  -Discussed potential evaluation for adrenal insufficiency, gastroparesis, biliary dyskinesia if EGD unrevealing.  Could consider trial of Reglan if ongoing symptoms.  Symptoms have significantly affected his ability to work including his energy level and frequent trips to the bathroom for nausea and vomiting.  I advised him today that I would renew his FMLA paperwork for another short period of time while undergoing evaluation including upper endoscopy however that if functional etiology suspected that we would have to work toward medical management and symptom control going forward. He is in agreement with this.    Plan:  Proceed with upper endoscopy with propofol by Dr. Tasia Catchings in near future: the risks, benefits, and alternatives have been discussed with the patient in detail. The patient states understanding and desires to proceed. ASA 2 Zofran as needed Nexium 40 mg BID Carafate 1g QID for 2 weeks then as needed GERD diet CBC, CMP, lipase, celiac panel May consider GES vs HIDA scan and trial of Reglan if EGD negative.  Also may consider assessing for adrenal insufficiency.  Follow up 2 months.     Follow Up Instructions:  2 months  I discussed the assessment and treatment plan with the patient. The patient was provided an opportunity to ask questions and all were answered. The patient agreed with the plan and demonstrated an understanding of the instructions.   The patient was advised to call back or seek an in-person evaluation if the symptoms worsen or if the condition fails to improve as anticipated.    Brooke Bonito, MSN, APRN, FNP-BC, AGACNP-BC [facility]

## 2023-05-24 NOTE — Telephone Encounter (Signed)
Called pt. Scheduled for EGD with Dr. Tasia Catchings, ASA 2 on 12/11. Aware will send his instructions via mychart as he will not get in time in mail.  Checked carelon and no PA required.

## 2023-05-24 NOTE — Patient Instructions (Addendum)
We are getting you scheduled for an upper endoscopy in the near future with Dr. Tasia Catchings.  I have refilled your Carafate and your Zofran.  Please continue to take the Nexium twice daily.  Follow a GERD diet:  Avoid fried, fatty, greasy, spicy, citrus foods. Avoid caffeine and carbonated beverages. Avoid chocolate. Try eating 4-6 small meals a day rather than 3 large meals. Do not eat within 3 hours of laying down. Prop head of bed up on wood or bricks to create a 6 inch incline.  Please complete the blood work ordered at your last visit before your upper endoscopy.  We will mail you the lab slips  We will consider further workup or different trial of medication if your upper endoscopy is unrevealing. Follow up 2 months.   It was a pleasure to see you today. I want to create trusting relationships with patients. If you receive a survey regarding your visit,  I greatly appreciate you taking time to fill this out on paper or through your MyChart. I value your feedback.  Brooke Bonito, MSN, FNP-BC, AGACNP-BC [facility]

## 2023-05-24 NOTE — Telephone Encounter (Signed)
Steve Colon, you are scheduled for a virtual visit with your provider today.  Just as we do with appointments in the office, we must obtain your consent to participate.  Your consent will be active for this visit and any virtual visit you may have with one of our providers in the next 365 days.  If you have a MyChart account, I can also send a copy of this consent to you electronically.  All virtual visits are billed to your insurance company just like a traditional visit in the office.  As this is a virtual visit, video technology does not allow for your provider to perform a traditional examination.  This may limit your provider's ability to fully assess your condition.  If your provider identifies any concerns that need to be evaluated in person or the need to arrange testing such as labs, EKG, etc, we will make arrangements to do so.  Although advances in technology are sophisticated, we cannot ensure that it will always work on either your end or our end.  If the connection with a video visit is poor, we may have to switch to a telephone visit.  With either a video or telephone visit, we are not always able to ensure that we have a secure connection.   I need to obtain your verbal consent now.   Are you willing to proceed with your visit today?  Patient consent to video visit.

## 2023-06-18 ENCOUNTER — Encounter (HOSPITAL_COMMUNITY): Payer: Self-pay | Admitting: Anesthesiology

## 2023-06-18 ENCOUNTER — Encounter (HOSPITAL_COMMUNITY): Admission: RE | Payer: Self-pay | Source: Ambulatory Visit

## 2023-06-18 ENCOUNTER — Ambulatory Visit (HOSPITAL_COMMUNITY)
Admission: RE | Admit: 2023-06-18 | Payer: BC Managed Care – PPO | Source: Ambulatory Visit | Admitting: Gastroenterology

## 2023-06-18 ENCOUNTER — Telehealth: Payer: Self-pay | Admitting: *Deleted

## 2023-06-18 SURGERY — ESOPHAGOGASTRODUODENOSCOPY (EGD) WITH PROPOFOL
Anesthesia: Monitor Anesthesia Care

## 2023-06-18 NOTE — Telephone Encounter (Signed)
Pt sent MyChart message stating he didn't have a ride for his procedure today and needed to reschedule.  He has been rescheduled to 07/09/23 at 11:30 am. Updated instructions sent via MyChart. FYI

## 2023-07-09 ENCOUNTER — Encounter (HOSPITAL_COMMUNITY)
Admission: RE | Disposition: A | Discharge status: Home / Self Care | Payer: Self-pay | Source: Ambulatory Visit | Attending: Gastroenterology

## 2023-07-09 ENCOUNTER — Ambulatory Visit (HOSPITAL_COMMUNITY): Payer: Self-pay | Admitting: Certified Registered Nurse Anesthetist

## 2023-07-09 ENCOUNTER — Other Ambulatory Visit: Payer: Self-pay

## 2023-07-09 ENCOUNTER — Encounter (HOSPITAL_COMMUNITY): Payer: Self-pay | Admitting: Gastroenterology

## 2023-07-09 ENCOUNTER — Ambulatory Visit (HOSPITAL_COMMUNITY)
Admission: RE | Admit: 2023-07-09 | Discharge: 2023-07-09 | Disposition: A | Discharge status: Home / Self Care | Payer: BC Managed Care – PPO | Source: Ambulatory Visit | Attending: Gastroenterology | Admitting: Gastroenterology

## 2023-07-09 DIAGNOSIS — K31A19 Gastric intestinal metaplasia without dysplasia, unspecified site: Secondary | ICD-10-CM | POA: Diagnosis not present

## 2023-07-09 DIAGNOSIS — F17290 Nicotine dependence, other tobacco product, uncomplicated: Secondary | ICD-10-CM | POA: Diagnosis not present

## 2023-07-09 DIAGNOSIS — K2950 Unspecified chronic gastritis without bleeding: Secondary | ICD-10-CM | POA: Insufficient documentation

## 2023-07-09 DIAGNOSIS — K449 Diaphragmatic hernia without obstruction or gangrene: Secondary | ICD-10-CM | POA: Insufficient documentation

## 2023-07-09 DIAGNOSIS — K2970 Gastritis, unspecified, without bleeding: Secondary | ICD-10-CM

## 2023-07-09 DIAGNOSIS — K3189 Other diseases of stomach and duodenum: Secondary | ICD-10-CM | POA: Diagnosis not present

## 2023-07-09 DIAGNOSIS — Z79899 Other long term (current) drug therapy: Secondary | ICD-10-CM | POA: Diagnosis not present

## 2023-07-09 DIAGNOSIS — B9681 Helicobacter pylori [H. pylori] as the cause of diseases classified elsewhere: Secondary | ICD-10-CM | POA: Diagnosis not present

## 2023-07-09 DIAGNOSIS — R112 Nausea with vomiting, unspecified: Secondary | ICD-10-CM | POA: Diagnosis present

## 2023-07-09 DIAGNOSIS — K2980 Duodenitis without bleeding: Secondary | ICD-10-CM

## 2023-07-09 HISTORY — DX: Headache, unspecified: R51.9

## 2023-07-09 HISTORY — PX: BIOPSY: SHX5522

## 2023-07-09 HISTORY — PX: ESOPHAGOGASTRODUODENOSCOPY (EGD) WITH PROPOFOL: SHX5813

## 2023-07-09 HISTORY — DX: Anxiety disorder, unspecified: F41.9

## 2023-07-09 SURGERY — ESOPHAGOGASTRODUODENOSCOPY (EGD) WITH PROPOFOL
Anesthesia: General

## 2023-07-09 MED ORDER — PROPOFOL 500 MG/50ML IV EMUL
INTRAVENOUS | Status: AC
  Filled 2023-07-09: qty 50

## 2023-07-09 MED ORDER — LACTATED RINGERS IV SOLN: INTRAVENOUS | Status: DC | PRN

## 2023-07-09 MED ORDER — LIDOCAINE HCL (PF) 2 % IJ SOLN
INTRAMUSCULAR | Status: AC
  Filled 2023-07-09: qty 5

## 2023-07-09 MED ORDER — LACTATED RINGERS IV SOLN: INTRAVENOUS | Status: DC

## 2023-07-09 MED ORDER — PROPOFOL 500 MG/50ML IV EMUL
INTRAVENOUS | Status: DC | PRN
  Administered 2023-07-09: 80 mg via INTRAVENOUS
  Administered 2023-07-09: 180 ug/kg/min via INTRAVENOUS

## 2023-07-09 MED ORDER — LIDOCAINE HCL (CARDIAC) PF 100 MG/5ML IV SOSY
PREFILLED_SYRINGE | INTRAVENOUS | Status: DC | PRN
  Administered 2023-07-09: 50 mg via INTRAVENOUS

## 2023-07-09 MED ORDER — ESOMEPRAZOLE MAGNESIUM 40 MG PO CPDR: 40.0000 mg | DELAYED_RELEASE_CAPSULE | Freq: Once | ORAL | 1 refills | Status: DC

## 2023-07-09 NOTE — Discharge Instructions (Signed)

## 2023-07-09 NOTE — H&P (Signed)
Primary Care Physician:  Patient, No Pcp Per Primary Gastroenterologist:  Dr. Tasia Catchings  Pre-Procedure History & Physical: HPI:  Steve Colon is a 28 y.o. male presenting for evaluation of nausea/vomiting, epigastric pain.    Has been having a month long of N/V or regurgitation after meals. After eating he gets an epigastric discomfort and then pani and then nausea and then vomiting. Occurring with almost every meal. Is only able to tolerate snack type of meals but not a larger meal.  No dysphagia.   Patient has been taking PPI and carafate without much relief Former smoker. Occasionally vaping.    Not having BM as frequent but no overt constipatio  Past Medical History:  Diagnosis Date   Kidney stones     Past Surgical History:  Procedure Laterality Date   CYSTOSCOPY W/ URETERAL STENT PLACEMENT Right 04/28/2013   Procedure: CYSTOSCOPY WITH RIGHT RETROGRADE PYELOGRAM/RIGHT URETERAL STENT PLACEMENT;  Surgeon: Crecencio Mc, MD;  Location: [facility];  Service: Urology;  Laterality: Right;   EYE SURGERY  4 yrs ago   to straighte eyes   ROBOT ASSISTED PYELOPLASTY Right 04/28/2013   Procedure: ROBOTIC ASSISTED PYELOPLASTY/ RIGHT PYELOLITHOTOMY;  Surgeon: Crecencio Mc, MD;  Location: [facility];  Service: Urology;  Laterality: Right;    Prior to Admission medications   Medication Sig Start Date End Date Taking? Authorizing Provider  esomeprazole (NEXIUM) 40 MG capsule Take 1 capsule (40 mg total) by mouth 2 (two) times daily before a meal. 01/23/23   Mahon, Frederik Schmidt, NP  ondansetron (ZOFRAN ODT) 8 MG disintegrating tablet Take 1 tablet (8 mg total) by mouth every 8 (eight) hours as needed for nausea or vomiting. 05/24/23   Aida Raider, NP  sucralfate (CARAFATE) 1 g tablet Take 1 tablet (1 g total) by mouth 4 (four) times daily -  with meals and at bedtime. May dissolve 1 tablet in 1-2 ounces of water and drink as a slurry. Take scheduled for 2 weeks then use as needed. 05/24/23   Aida Raider,  NP    Allergies as of 06/21/2023 - Review Complete 05/24/2023  Allergen Reaction Noted   Codeine Anaphylaxis and Swelling 04/30/2012   Toradol [ketorolac tromethamine] Anaphylaxis and Swelling 04/18/2013   Tramadol Nausea And Vomiting 02/13/2013   Barium-containing compounds Rash 04/24/2013    Family History  Problem Relation Age of Onset   GER disease Father    Gallbladder disease Father    Colon cancer Neg Hx    Colon polyps Neg Hx     Social History   Socioeconomic History   Marital status: Married    Spouse name: Not on file   Number of children: Not on file   Years of education: Not on file   Highest education level: Not on file  Occupational History   Not on file  Tobacco Use   Smoking status: Former    Current packs/day: 0.00    Average packs/day: 0.5 packs/day for 2.0 years (1.0 ttl pk-yrs)    Types: Cigarettes    Quit date: 09/04/2021    Years since quitting: 1.8   Smokeless tobacco: Never  Vaping Use   Vaping status: Some Days  Substance and Sexual Activity   Alcohol use: No   Drug use: No   Sexual activity: Yes    Birth control/protection: None  Other Topics Concern   Not on file  Social History Narrative   Not on file   Social Drivers of Health   Financial Resource Strain:  Not on file  Food Insecurity: Not on file  Transportation Needs: Not on file  Physical Activity: Not on file  Stress: Not on file  Social Connections: Not on file  Intimate Partner Violence: Not on file    Review of Systems: See HPI, otherwise negative ROS  Physical Exam: Vital signs in last 24 hours:     General:   Alert,  Well-developed, well-nourished, pleasant and cooperative in NAD Head:  Normocephalic and atraumatic. Eyes:  Sclera clear, no icterus.   Conjunctiva pink. Ears:  Normal auditory acuity. Nose:  No deformity, discharge,  or lesions. Msk:  Symmetrical without gross deformities. Normal posture. Extremities:  Without clubbing or edema. Neurologic:   Alert and  oriented x4;  grossly normal neurologically. Skin:  Intact without significant lesions or rashes. Psych:  Alert and cooperative. Normal mood and affect.  Impression/Plan:   Steve Colon is a 28 y.o. male presenting for evaluation of nausea/vomiting, epigastric pain.   I discussed with patient the risk ,benefit and indication of BRAVO placement given his persistent symptoms and he does not want it as  " he does not want any device left inside him'  At this time we will proceed with EGD with biopsies   The risks of the procedure including infection, bleed, or perforation as well as benefits, limitations, alternatives and imponderables have been reviewed with the patient. Questions have been answered. All parties agreeable.

## 2023-07-09 NOTE — Transfer of Care (Signed)
Immediate Anesthesia Transfer of Care Note  Patient: Steve Colon  Procedure(s) Performed: ESOPHAGOGASTRODUODENOSCOPY (EGD) WITH PROPOFOL BIOPSY  Patient Location: Endoscopy Unit  Anesthesia Type:General  Level of Consciousness: awake, alert , and oriented  Airway & Oxygen Therapy: Patient Spontanous Breathing  Post-op Assessment: Report given to RN, Post -op Vital signs reviewed and stable, Patient moving all extremities X 4, and Patient able to stick tongue midline  Post vital signs: Reviewed and stable  Last Vitals:  Vitals Value Taken Time  BP 102/55 07/09/23 1100  Temp 36.8 C 07/09/23 1057  Pulse 60 07/09/23 1100  Resp 20 07/09/23 1100  SpO2 96 % 07/09/23 1100    Last Pain:  Vitals:   07/09/23 1057  TempSrc: Axillary  PainSc: 0-No pain         Complications: No notable events documented.

## 2023-07-09 NOTE — Anesthesia Preprocedure Evaluation (Signed)
Anesthesia Evaluation  Patient identified by MRN, date of birth, ID band Patient awake    Reviewed: Allergy & Precautions, H&P , NPO status , Patient's Chart, lab work & pertinent test results, reviewed documented beta blocker date and time   Airway Mallampati: II  TM Distance: >3 FB Neck ROM: full    Dental no notable dental hx.    Pulmonary neg pulmonary ROS, former smoker   Pulmonary exam normal breath sounds clear to auscultation       Cardiovascular Exercise Tolerance: Good hypertension, negative cardio ROS  Rhythm:regular Rate:Normal     Neuro/Psych  Headaches  Anxiety     negative neurological ROS  negative psych ROS   GI/Hepatic negative GI ROS, Neg liver ROS,,,  Endo/Other  negative endocrine ROS    Renal/GU Renal diseasenegative Renal ROS  negative genitourinary   Musculoskeletal   Abdominal   Peds  Hematology negative hematology ROS (+)   Anesthesia Other Findings   Reproductive/Obstetrics negative OB ROS                             Anesthesia Physical Anesthesia Plan  ASA: 2  Anesthesia Plan: General   Post-op Pain Management:    Induction:   PONV Risk Score and Plan: Propofol infusion  Airway Management Planned:   Additional Equipment:   Intra-op Plan:   Post-operative Plan:   Informed Consent: I have reviewed the patients History and Physical, chart, labs and discussed the procedure including the risks, benefits and alternatives for the proposed anesthesia with the patient or authorized representative who has indicated his/her understanding and acceptance.     Dental Advisory Given  Plan Discussed with: CRNA  Anesthesia Plan Comments:        Anesthesia Quick Evaluation

## 2023-07-09 NOTE — Anesthesia Postprocedure Evaluation (Signed)
Anesthesia Post Note  Patient: Steve Colon  Procedure(s) Performed: ESOPHAGOGASTRODUODENOSCOPY (EGD) WITH PROPOFOL BIOPSY  Patient location during evaluation: Phase II Anesthesia Type: General Level of consciousness: awake Pain management: pain level controlled Vital Signs Assessment: post-procedure vital signs reviewed and stable Respiratory status: spontaneous breathing and respiratory function stable Cardiovascular status: blood pressure returned to baseline and stable Postop Assessment: no headache and no apparent nausea or vomiting Anesthetic complications: no Comments: Late entry   No notable events documented.   Last Vitals:  Vitals:   07/09/23 1057 07/09/23 1100  BP: (!) 85/52 (!) 102/55  Pulse: (!) 59 60  Resp: 20 20  Temp: 36.8 C   SpO2: 96% 96%    Last Pain:  Vitals:   07/09/23 1057  TempSrc: Axillary  PainSc: 0-No pain                 Windell Norfolk

## 2023-07-09 NOTE — Op Note (Signed)
[facility] Patient Name: Festus Lennox Procedure Date: 07/09/2023 10:28 AM MRN: [id number] Date of Birth: 1995-07-29 Attending MD: Sanjuan Dame , MD, [id number] CSN: [id number] Age: 28 Admit Type: Outpatient Procedure:                Upper GI endoscopy Indications:              Epigastric abdominal pain, Nausea with vomiting Providers:                Sanjuan Dame, MD, Sheran Fava, Zena Amos Referring MD:              Medicines:                Monitored Anesthesia Care Complications:            No immediate complications. Estimated Blood Loss:     Estimated blood loss: none. Procedure:                Pre-Anesthesia Assessment:                           - Prior to the procedure, a History and Physical                            was performed, and patient medications and                            allergies were reviewed. The patient's tolerance of                            previous anesthesia was also reviewed. The risks                            and benefits of the procedure and the sedation                            options and risks were discussed with the patient.                            All questions were answered, and informed consent                            was obtained. Prior Anticoagulants: The patient has                            taken no anticoagulant or antiplatelet agents. ASA                            Grade Assessment: II - A patient with mild systemic                            disease. After reviewing the risks and benefits,  the patient was deemed in satisfactory condition to                            undergo the procedure.                           After obtaining informed consent, the endoscope was                            passed under direct vision. Throughout the                            procedure, the patient's blood pressure, pulse, and                            oxygen  saturations were monitored continuously. The                            [id number]-H190 ([id number]) scope was introduced through the                            mouth, and advanced to the second part of duodenum.                            The upper GI endoscopy was accomplished without                            difficulty. The patient tolerated the procedure                            well. Scope In: 10:48:46 AM Scope Out: 10:53:47 AM Total Procedure Duration: 0 hours 5 minutes 1 second  Findings:      Esophagogastric landmarks were identified: the Z-line was found at 38 cm       and the gastroesophageal junction was found at 41 cm from the incisors.      A 3 cm hiatal hernia was present.      There is no endoscopic evidence of esophagitis in the entire esophagus.       Biopsies were obtained from the proximal and distal esophagus with cold       forceps for histology of suspected eosinophilic esophagitis.      Mildly erythematous mucosa without bleeding was found in the gastric       antrum. Biopsies were taken with a cold forceps for histology.      The duodenal bulb and second portion of the duodenum were normal.       Biopsies for histology were taken with a cold forceps for evaluation of       celiac disease. Impression:               - Esophagogastric landmarks identified.                           - 3 cm hiatal hernia.                           - Erythematous mucosa  in the antrum. Biopsied.                           - Normal duodenal bulb and second portion of the                            duodenum. Biopsied.                           - Biopsies were taken with a cold forceps for                            evaluation of eosinophilic esophagitis. Moderate Sedation:      Per Anesthesia Care Recommendation:           - Patient has a contact number available for                            emergencies. The signs and symptoms of potential                            delayed complications  were discussed with the                            patient. Return to normal activities tomorrow.                            Written discharge instructions were provided to the                            patient.                           - Resume previous diet.                           - Continue present medications.                           - Await pathology results.                           - Return to GI clinic as previously scheduled.                           -If biopsies negative , patient may benefit from                            gastric emptying study and 24 hour pH impedence                           -Absolute smoking cessation Procedure Code(s):        --- Professional ---                           [phone], Esophagogastroduodenoscopy, flexible,  transoral; with biopsy, single or multiple Diagnosis Code(s):        --- Professional ---                           K44.9, Diaphragmatic hernia without obstruction or                            gangrene                           K31.89, Other diseases of stomach and duodenum                           R10.13, Epigastric pain                           R11.2, Nausea with vomiting, unspecified CPT copyright 2022 American Medical Association. All rights reserved. The codes documented in this report are preliminary and upon coder review may  be revised to meet current compliance requirements. Sanjuan Dame, MD Sanjuan Dame, MD 07/09/2023 11:04:07 AM This report has been signed electronically. Number of Addenda: 0

## 2023-07-10 ENCOUNTER — Encounter (HOSPITAL_COMMUNITY): Payer: Self-pay | Admitting: Gastroenterology

## 2023-07-11 LAB — SURGICAL PATHOLOGY

## 2023-07-12 ENCOUNTER — Other Ambulatory Visit (HOSPITAL_COMMUNITY): Payer: Self-pay | Admitting: Gastroenterology

## 2023-07-12 DIAGNOSIS — A048 Other specified bacterial intestinal infections: Secondary | ICD-10-CM

## 2023-07-12 MED ORDER — DOXYCYCLINE MONOHYDRATE 100 MG PO TABS: 100.0000 mg | ORAL_TABLET | Freq: Two times a day (BID) | ORAL | 0 refills | Status: DC

## 2023-07-12 MED ORDER — BISMUTH 262 MG PO CHEW: 2.0000 | CHEWABLE_TABLET | Freq: Four times a day (QID) | ORAL | 0 refills | Status: DC

## 2023-07-12 MED ORDER — PANTOPRAZOLE SODIUM 40 MG PO TBEC: 40.0000 mg | DELAYED_RELEASE_TABLET | Freq: Two times a day (BID) | ORAL | 0 refills | Status: DC

## 2023-07-12 MED ORDER — METRONIDAZOLE 500 MG PO TABS: 500.0000 mg | ORAL_TABLET | Freq: Three times a day (TID) | ORAL | 0 refills | Status: DC

## 2023-07-12 NOTE — Progress Notes (Signed)
I reviewed the pathology results. Ann, can you send her a letter with the findings as described below please?  Thanks,  Vista Lawman, MD Gastroenterology and Hepatology [facility]  ---------------------------------------------------------------------------------------------  [facility] [address], [place], [place] [address] Phone:  [phone]   07/12/23 [place], [place]   Dear [name],  I am writing to inform you that the biopsies taken during your recent endoscopic examination showed:  I am writing to let you know the results of your recent endoscopy (EGD).  You were found to have an infection called H. pylori, which is a bacteria that lives in the stomach. We will send you a pack of medications to take for 14 days: 3 antibiotics and an acid blocking medication Pantoprazole twice daily. Take them all twice a day. It is VERY IMPORTANT that you take all of the medications as directed. If the infection is not fully treated, in can increase your risk of stomach cancer.  Also during taking these antibiotics do not drink alcohol   After that continue to follow up in GI clinic to assess eradication of this bacteria   Also I value your feedback , so if you get a survey , please take the time to fill it out and thank you for choosing [facility]  Please call us at [phone] if you have persistent problems or have questions about your condition that have not been fully answered at this time.  Sincerely,  Vista Lawman, MD Gastroenterology and Hepatology  ===========

## 2023-07-13 ENCOUNTER — Encounter (INDEPENDENT_AMBULATORY_CARE_PROVIDER_SITE_OTHER): Payer: Self-pay | Admitting: *Deleted

## 2023-07-16 ENCOUNTER — Other Ambulatory Visit: Payer: Self-pay | Admitting: Gastroenterology

## 2023-07-16 DIAGNOSIS — A048 Other specified bacterial intestinal infections: Secondary | ICD-10-CM

## 2023-07-16 MED ORDER — PANTOPRAZOLE SODIUM 40 MG PO TBEC: 40.0000 mg | DELAYED_RELEASE_TABLET | Freq: Two times a day (BID) | ORAL | 0 refills | Status: DC

## 2023-07-16 MED ORDER — DOXYCYCLINE MONOHYDRATE 100 MG PO TABS: 100.0000 mg | ORAL_TABLET | Freq: Two times a day (BID) | ORAL | 0 refills | Status: AC

## 2023-07-16 MED ORDER — METRONIDAZOLE 500 MG PO TABS: 500.0000 mg | ORAL_TABLET | Freq: Three times a day (TID) | ORAL | 0 refills | Status: AC

## 2023-07-16 MED ORDER — BISMUTH 262 MG PO CHEW: 2.0000 | CHEWABLE_TABLET | Freq: Four times a day (QID) | ORAL | 0 refills | Status: AC

## 2023-07-30 ENCOUNTER — Other Ambulatory Visit: Payer: Self-pay | Admitting: Gastroenterology

## 2023-07-30 DIAGNOSIS — R112 Nausea with vomiting, unspecified: Secondary | ICD-10-CM

## 2023-07-30 DIAGNOSIS — R1010 Upper abdominal pain, unspecified: Secondary | ICD-10-CM

## 2023-07-30 NOTE — Telephone Encounter (Signed)
 PA approved via carelon Order ID: [id number]       Authorized  Approval Valid Through: 07/30/2023 - 09/27/2023

## 2023-08-31 ENCOUNTER — Ambulatory Visit (HOSPITAL_COMMUNITY): Payer: BC Managed Care – PPO

## 2023-09-10 ENCOUNTER — Ambulatory Visit (INDEPENDENT_AMBULATORY_CARE_PROVIDER_SITE_OTHER): Payer: BC Managed Care – PPO | Admitting: Gastroenterology

## 2023-09-10 ENCOUNTER — Encounter: Payer: Self-pay | Admitting: Gastroenterology

## 2023-09-10 VITALS — BP 122/85 | HR 67 | Temp 97.7°F | Ht 67.0 in | Wt 152.2 lb

## 2023-09-10 DIAGNOSIS — R1013 Epigastric pain: Secondary | ICD-10-CM

## 2023-09-10 DIAGNOSIS — K219 Gastro-esophageal reflux disease without esophagitis: Secondary | ICD-10-CM | POA: Diagnosis not present

## 2023-09-10 DIAGNOSIS — R140 Abdominal distension (gaseous): Secondary | ICD-10-CM

## 2023-09-10 DIAGNOSIS — B9681 Helicobacter pylori [H. pylori] as the cause of diseases classified elsewhere: Secondary | ICD-10-CM

## 2023-09-10 DIAGNOSIS — R112 Nausea with vomiting, unspecified: Secondary | ICD-10-CM

## 2023-09-10 DIAGNOSIS — R1010 Upper abdominal pain, unspecified: Secondary | ICD-10-CM

## 2023-09-10 DIAGNOSIS — K2970 Gastritis, unspecified, without bleeding: Secondary | ICD-10-CM

## 2023-09-10 DIAGNOSIS — A048 Other specified bacterial intestinal infections: Secondary | ICD-10-CM

## 2023-09-10 MED ORDER — METOCLOPRAMIDE HCL 5 MG PO TABS: 5.0000 mg | ORAL_TABLET | Freq: Two times a day (BID) | ORAL | 2 refills | Status: AC

## 2023-09-10 MED ORDER — ONDANSETRON 8 MG PO TBDP
8.0000 mg | ORAL_TABLET | Freq: Three times a day (TID) | ORAL | 1 refills | Status: DC | PRN
Start: 2023-09-10 — End: 2023-11-05

## 2023-09-10 MED ORDER — ESOMEPRAZOLE MAGNESIUM 40 MG PO CPDR: 40.0000 mg | DELAYED_RELEASE_CAPSULE | Freq: Two times a day (BID) | ORAL | 3 refills | Status: DC

## 2023-09-10 NOTE — Patient Instructions (Addendum)
 Please have breath test completed at [facility].  We will call you with results once they have been received. Please allow 3-5 business days for review. 2 locations for [facility] in [place]:              1. [address], [place]              2. [address] [place], [place]    After you give the breath test sample you may resume your Nexium 40 mg twice daily.    I have sent in Reglan for you to take 5 mg twice daily, once before breakfast and once before dinner or which ever to meals or your largest of the day. Monitor for involuntary movements (twitching of extremities,  face or eyes), headaches, vision changes, dizziness, confusion, difficulty with urinating, changes in heart rate.  As we discussed this usually typically occurs with higher doses and more frequent doses but if it does occur stop immediately.  Gastroparesis recommendations:  4-6 small meals daily Low fat diet Low fiber diet (avoid raw fruits and vegetables).  Plan going forward: If no significant improvement with Reglan and resumption of Nexium and H. pylori is negative then we will plan for CT of the abdomen pelvis and send referral to assess the level of acid and amount of reflux you are having as well as pressures of the esophagus to measure how well your esophagus is working.  You may continue Zofran as needed for nausea, I have sent refill to pharmacy for you today.  Follow-up in 8 weeks.  It was a pleasure to see you today. I want to create trusting relationships with patients. If you receive a survey regarding your visit,  I greatly appreciate you taking time to fill this out on paper or through your MyChart. I value your feedback.  Brooke Bonito, MSN, FNP-BC, AGACNP-BC [facility]

## 2023-09-10 NOTE — Progress Notes (Signed)
 GI Office Note    Referring Provider: No ref. provider found Primary Care Physician:  Patient, No Pcp Per Primary Gastroenterologist: Vista Lawman, MD  Date:  09/10/2023  ID:  Steve Colon, DOB Aug 25, 1995, MRN [id number]   Chief Complaint   Chief Complaint  Patient presents with   Follow-up    Follow up. Still having stomach pains and some nausea    History of Present Illness  Steve Colon is a 28 y.o. male with a history of kidney stones, GERD, and chronic nausea/vomiting presenting today for follow-up with complaint of ongoing abdominal pain and nausea.  Urgent care visit in early July for nausea and vomiting.   Initial office visit 01/11/2023.  Patient had a month-long of nausea/vomiting or regurgitation after meals.  This is associated with epigastric discomfort as well as pain.  This occurs with almost every meal.  Has only been able to tolerate snack types of meals, not larger meals.  Continues to have epigastric pain/pressure even after vomiting but nausea will subside.  Had not had any improvement with famotidine twice daily or low-dose Zofran.  Denies any dysphagia.  Given his decreased p.o. intake he did report some weight loss.  Had reported ibuprofen use, to tablets daily for couple of months.  Currently vaping, former smoker.  Advised to avoid all NSAIDs, follow GERD diet, start pantoprazole 40 mg twice daily and continue famotidine as needed for breakthrough.  Zofran refilled.  FMLA paperwork filled out.  Advised to complete labs and ultrasound if no improvement in 1-2 weeks.   RUQ [place] 05/23/23: -CBD 3.65mm - No cholelithiasis or choledocholithiasis  Last office visit 05/24/23 virtually.  Admitted to vaping.  States he quit smoking cigarettes in March or 2023.  Still waking in the mornings with dry heaving and vomiting.  Stomach feels on fire after eating anything.  If he eats a large meal he has abdominal pain within an hour and the next morning he has dry heaves  and vomits up stomach acid.  Taking Nexium twice daily.  Avoiding NSAIDs.  Rare alcohol use..  EGD 07/09/23: - 3 cm hiatal hernia.  - Erythematous mucosa in the antrum. Biopsied.  - Normal duodenal bulb and second portion of the duodenum. Biopsied.  - Biopsies were taken with a cold forceps for evaluation of eosinophilic esophagitis. - Path: Small bowel biopsies with foveolar metaplasia consistent with chronic peptic duodenitis, gastric biopsies with chronic inactive gastritis and H. pylori identified, esophageal biopsies normal. - Advised of biopsies negative, would benefit from GES and 24-hour pH impedance.  Also advised smoking cessation. -Bismuth, doxycycline, Nexium, and metronidazole sent in for patient.  On 1/27 instructed patient to pick up medications at pharmacy for him and resume his PPI. On 1/31 he reported pain was worsening along with vomiting. Still occurring 2/10 therefore offered dicyclomine but weary of side effects therefore he declined.  CT offered.  Also reported diarrhea for the last week with bleeding on his bottom.  Advised a brat diet.  Advised sink paste and keeping area dry if skin is raw, if rectal bleeding then advised office visit.  To source 13 advised him to stop Nexium on 2/27 and go on 3/14 to do the breath test.  Instructed Carafate and famotidine as needed during this time.  Filled out some short-term disability paperwork for patient given frequently out of work due to severe nausea, vomiting, and abdominal pain.  CT A/P ordered but has not been performed.  Today:  Right  after EGD he said dr Tasia Catchings said there was nothing to be alarmed about and told him to stop tacking his medication then he got confused because of the nexium. Does have snersation of food getting stuck mid chest. Has been giong on for a long time.   Can have sharp shooing pains in chest and sometimes it will be so tight for a minut or less and may happen multiple times a day or one after the  other .Will hold his breath if that happens. This has been happening for a very long time and is occurring more frequent.ly.   Pepto bismol has been using lots of that.   Has not had any medication since 2/24 or a little earlier than that.   Yesterday ate one chicken patty stomach felt moe bloated and swollen. Even with eating a little snack (peanut utter crackers) it feels puffy and swollen to him. Extremely gassy and burping a lot - even with water that is making him burp.   Wt Readings from Last 3 Encounters:  09/10/23 152 lb 3.2 oz (69 kg)  07/09/23 155 lb (70.3 kg)  05/24/23 155 lb (70.3 kg)    Current Outpatient Medications  Medication Sig Dispense Refill   ondansetron (ZOFRAN ODT) 8 MG disintegrating tablet Take 1 tablet (8 mg total) by mouth every 8 (eight) hours as needed for nausea or vomiting. 20 tablet 1   pantoprazole (PROTONIX) 40 MG tablet Take 1 tablet (40 mg total) by mouth 2 (two) times daily for 14 days. 28 tablet 0   No current facility-administered medications for this visit.    Past Medical History:  Diagnosis Date   Anxiety    Headache    Kidney stones     Past Surgical History:  Procedure Laterality Date   BIOPSY  07/09/2023   Procedure: BIOPSY;  Surgeon: Franky Macho, MD;  Location: [facility];  Service: Endoscopy;;   CYSTOSCOPY W/ URETERAL STENT PLACEMENT Right 04/28/2013   Procedure: CYSTOSCOPY WITH RIGHT RETROGRADE PYELOGRAM/RIGHT URETERAL STENT PLACEMENT;  Surgeon: Crecencio Mc, MD;  Location: [facility];  Service: Urology;  Laterality: Right;   ESOPHAGOGASTRODUODENOSCOPY (EGD) WITH PROPOFOL N/A 07/09/2023   Procedure: ESOPHAGOGASTRODUODENOSCOPY (EGD) WITH PROPOFOL;  Surgeon: Franky Macho, MD;  Location: [facility];  Service: Endoscopy;  Laterality: N/A;  11:30 am, asa 2   EYE SURGERY  4 yrs ago   to straighte eyes   ROBOT ASSISTED PYELOPLASTY Right 04/28/2013   Procedure: ROBOTIC ASSISTED PYELOPLASTY/ RIGHT PYELOLITHOTOMY;  Surgeon: Crecencio Mc, MD;  Location: [facility];  Service: Urology;  Laterality: Right;    Family History  Problem Relation Age of Onset   GER disease Father    Gallbladder disease Father    Colon cancer Neg Hx    Colon polyps Neg Hx     Allergies as of 09/10/2023 - Review Complete 09/10/2023  Allergen Reaction Noted   Codeine Anaphylaxis and Swelling 04/30/2012   Toradol [ketorolac tromethamine] Anaphylaxis and Swelling 04/18/2013   Tramadol Nausea And Vomiting 02/13/2013   Barium-containing compounds Rash 04/24/2013    Social History   Socioeconomic History   Marital status: Married    Spouse name: Not on file   Number of children: Not on file   Years of education: Not on file   Highest education level: Not on file  Occupational History   Not on file  Tobacco Use   Smoking status: Former    Current packs/day: 0.00    Average packs/day: 0.5  packs/day for 2.0 years (1.0 ttl pk-yrs)    Types: Cigarettes    Quit date: 09/04/2021    Years since quitting: 2.0   Smokeless tobacco: Never  Vaping Use   Vaping status: Some Days  Substance and Sexual Activity   Alcohol use: No   Drug use: Yes    Types: Marijuana    Comment: 1-2 times a week   Sexual activity: Yes    Birth control/protection: None  Other Topics Concern   Not on file  Social History Narrative   Not on file   Social Drivers of Health   Financial Resource Strain: Not on file  Food Insecurity: Not on file  Transportation Needs: Not on file  Physical Activity: Not on file  Stress: Not on file  Social Connections: Not on file     Review of Systems   Gen: Denies fever, chills, anorexia. Denies fatigue, weakness, weight loss.  CV: Denies chest pain, palpitations, syncope, peripheral edema, and claudication. Resp: Denies dyspnea at rest, cough, wheezing, coughing up blood, and pleurisy. GI: See HPI Derm: Denies rash, itching, dry skin Psych: Denies depression, anxiety, memory loss, confusion. No homicidal or suicidal  ideation.  Heme: Denies bruising, bleeding, and enlarged lymph nodes.  Physical Exam   BP 122/85 (BP Location: Right Arm, Patient Position: Sitting, Cuff Size: Normal)   Pulse 67   Temp 97.7 F (36.5 C) (Temporal)   Ht 5\' 7"  (1.702 m)   Wt 152 lb 3.2 oz (69 kg)   BMI 23.84 kg/m   General:   Alert and oriented. No distress noted. Pleasant and cooperative.  Head:  Normocephalic and atraumatic. Eyes:  Conjuctiva clear without scleral icterus. Abdomen:  +BS, soft, non-distended.  TTP to periumbilical, epigastric, and left upper quadrant.  No rebound or guarding. No HSM or masses noted. Rectal: deferred  Msk:  Symmetrical without gross deformities. Normal posture. Extremities:  Without edema. Neurologic:  Alert and  oriented x4 Psych:  Alert and cooperative. Normal mood and affect.  Assessment  Steve Colon is a 28 y.o. male with a history of kidney stones, GERD, and chronic nausea/vomiting presenting today for follow-up with ongoing nausea, bloating, epigastric pain, and reflux symptoms.  Nausea/vomiting, GERD, epigastric burning, bloating: -Continuous bloating feeling and fullness sensation with epigastric burning post meals -Previously prescribed PPI (Nexium) and was taking twice daily with some improvement -Zofran helps with nausea -Recent EGD with evidence of H. pylori gastritis. treated with bismuth, doxycycline, metronidazole, and pantoprazole 40 mg twice daily.  He was taking Nexium and pantoprazole twice daily therefore advised to take only 1 PPI once daily.  -Stopped medication 2/24 as instructed but has not yet performed H. pylori breath test, having worsening abdominal pain with bloating and frequent burping/reflux symptoms. -Given nausea and bloating sensation with meals, need to rule out gastroparesis - Unable to do GES given oral barium allergy. (If necessary to confirm could consider pretreatment of allergy prior to performing).  -Will treat empirically for possible  gastroparesis/delayed gastric emptying with Reglan -Can perform CT A/P if ongoing abdominal pain despite Reglan and resumption of PPI. - May need to consider neuromodulator if all testing negative.  -Has noted some mild dysphagia for a while, thought this is baseline and normal.  If no improvement of symptoms, will refer for pH impedance and manometry.  H. pylori gastritis: -Pathology confirmation during recent EGD. - treated with bismuth, doxycycline, metronidazole, and pantoprazole 40 mg twice daily.  He was taking Nexium and pantoprazole twice daily  therefore advised to take only 1 PPI once daily.  -Has been without medication since 2/24 given was instructed to do this and then perform H. pylori breath test 2 weeks later, has not yet performed breath test. -Ordering breath test today given he has been without PPI for 2 weeks and off antibiotics for 4 weeks. -Will retreat with optimized bismuth quadruple therapy with tetracycline if H. pylori breath test positive. -Could explain some of his bloating/upper abdominal discomfort.  PLAN   H. Pylori breath test CT A/P if ongoing symptoms Reglan 5mg  BID before meals. Counseled on side effects.  Resume Nexium 40mg  BID after breath test Continue Zofran as needed Ph Impedence and manometry if ongoing symptoms.  Separate written education about bloating, hiatal hernia, and gastroparesis provided Follow up 8 weeks.    Brooke Bonito, MSN, FNP-BC, AGACNP-BC [facility]

## 2023-10-30 NOTE — Progress Notes (Signed)
 GI Office Note    Referring Provider: No ref. provider found Primary Care Physician:  Patient, No Pcp Per Primary Gastroenterologist: Muhammad Faizan Ahmed, MD  Date:  11/05/2023  ID:  Steve Colon, DOB 08-17-1995, MRN [id number]   Chief Complaint   Chief Complaint  Patient presents with   Follow-up    Follow up on H Pylori and follow up abd pain   History of Present Illness  Steve Colon is a 28 y.o. male with a history of kidney stones, GERD, chronic nausea/vomiting with abdominal pain presenting today for follow-up of chronic GI symptoms.  Urgent care visit in early July 2024 for nausea and vomiting.   Initial office visit 01/11/2023.  Patient had a month-long of nausea/vomiting or regurgitation after meals.  This is associated with epigastric discomfort as well as pain.  This occurs with almost every meal.  Has only been able to tolerate snack types of meals, not larger meals.  Continues to have epigastric pain/pressure even after vomiting but nausea will subside.  Had not had any improvement with famotidine twice daily or low-dose Zofran .  Denies any dysphagia.  Given his decreased p.o. intake he did report some weight loss.  Had reported ibuprofen  use, to tablets daily for couple of months.  Currently vaping, former smoker.  Advised to avoid all NSAIDs, follow GERD diet, start pantoprazole  40 mg twice daily and continue famotidine as needed for breakthrough.  Zofran  refilled.  FMLA paperwork filled out.  Advised to complete labs and ultrasound if no improvement in 1-2 weeks.   RUQ US  05/23/23: -CBD 3.73mm - No cholelithiasis or choledocholithiasis   OV 05/24/23 virtually.  Admitted to vaping.  States he quit smoking cigarettes in March or 2023.  Still waking in the mornings with dry heaving and vomiting.  Stomach feels on fire after eating anything.  If he eats a large meal he has abdominal pain within an hour and the next morning he has dry heaves and vomits up stomach acid.   Taking Nexium  twice daily.  Avoiding NSAIDs.  Rare alcohol use..   EGD 07/09/23: - 3 cm hiatal hernia.  - Erythematous mucosa in the antrum. Biopsied.  - Normal duodenal bulb and second portion of the duodenum. Biopsied.  - Biopsies were taken with a cold forceps for evaluation of eosinophilic esophagitis. - Path: Small bowel biopsies with foveolar metaplasia consistent with chronic peptic duodenitis, gastric biopsies with chronic inactive gastritis and H. pylori identified, esophageal biopsies normal. - Advised of biopsies negative, would benefit from GES and 24-hour pH impedance.  Also advised smoking cessation. -Bismuth , doxycycline , Nexium , and metronidazole  sent in for patient.   On 1/27 instructed patient to pick up medications at pharmacy for him and resume his PPI. On 1/31 he reported pain was worsening along with vomiting. Still occurring 2/10 therefore offered dicyclomine  but weary of side effects therefore he declined.  CT offered.  Also reported diarrhea for the last week with bleeding on his bottom.  Advised a brat diet.  Advised sink paste and keeping area dry if skin is raw, if rectal bleeding then advised office visit.   Advised him to stop Nexium  on 2/27 and go on 3/14 to do the breath test.  Instructed Carafate  and famotidine as needed during this time.  Filled out some short-term disability paperwork for patient given frequently out of work due to severe nausea, vomiting, and abdominal pain.  Last office visit 09/10/23.  Patient reported that after his upper endoscopy the physician  told him there was nothing to be alarmed about and he should stop taking his medication so he was confused given the Nexium .  Had sensation of food getting stuck mid chest had been going on for a long time.  Reports sharp shooting pain in his chest sometimes will be so tight for a minute or less and then can occur multiple times per day.  Usually will have to hold his breath that this happens and is  becoming more frequent.  Taking Pepto-Bismol frequently.  Feeling bloated after eating a chicken patty the day prior.  Symptoms you have little snacks like peanut butter crackers he feels bloated and distended.  Reports extremely gassy and belching lots.  Advised to perform the H. pylori breath test given he had been off medication.  Advised would retreat with optimized bismuth  quadruple therapy with tetracycline if his breath test remain positive.  Advised CT abdomen pelvis if ongoing symptoms.  Discussed Reglan  and ordered 5 mg twice daily before meals and counseled on his side effects.  Advised pH impedance and manometry if having ongoing symptoms.  Separate written education provided..  Today:  For about 1-1.5 weeks having GERD symptoms (burning in his throat and has been worse but the last 3 days have been the worst.   Has noticed with his emesis it is mostly stomach acid and not food.   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 Feesl lots of pressure and hurts more mid abdomen and through the epigastric region and can get to where it is a sharp stab but feels like someone with a fist mashing on his abdomen. A couple times he has had some dry heaving. Eats about one meal per day still. Last not vomited up veggies.   Wt Readings from Last 3 Encounters:  11/05/23 157 lb 12.8 oz (71.6 kg)  09/10/23 152 lb 3.2 oz (69 kg)  07/09/23 155 lb (70.3 kg)    Current Outpatient Medications  Medication Sig Dispense Refill   ondansetron  (ZOFRAN  ODT) 8 MG disintegrating tablet Take 1 tablet (8 mg total) by mouth every 8 (eight) hours as needed for nausea or vomiting. 20 tablet 1   sucralfate  (CARAFATE ) 1 g tablet Take 1 g by mouth 4 (four) times daily -  with meals and at bedtime.     esomeprazole  (NEXIUM ) 40 MG capsule Take 1 capsule (40 mg total) by mouth 2 (two) times daily before a meal. (Patient not taking: Reported on  11/05/2023) 60 capsule 3   metoCLOPramide  (REGLAN ) 5 MG tablet Take 1 tablet (5 mg total) by mouth 2 (two) times daily. (Patient not taking: Reported on 11/05/2023) 60 tablet 2   No current facility-administered medications for this visit.    Past Medical History:  Diagnosis Date   Anxiety    Headache    Kidney stones     Past Surgical History:  Procedure Laterality Date   BIOPSY  07/09/2023   Procedure: BIOPSY;  Surgeon: Hargis Lias, MD;  Location: [facility];  Service: Endoscopy;;   CYSTOSCOPY W/ URETERAL STENT PLACEMENT Right 04/28/2013   Procedure: CYSTOSCOPY WITH RIGHT RETROGRADE PYELOGRAM/RIGHT URETERAL STENT PLACEMENT;  Surgeon: Kristeen Peto, MD;  Location: [facility];  Service: Urology;  Laterality: Right;   ESOPHAGOGASTRODUODENOSCOPY (EGD) WITH PROPOFOL  N/A 07/09/2023   Procedure: ESOPHAGOGASTRODUODENOSCOPY (EGD) WITH PROPOFOL ;  Surgeon: Hargis Lias, MD;  Location: [facility];  Service: Endoscopy;  Laterality:  N/A;  11:30 am, asa 2   EYE SURGERY  4 yrs ago   to straighte eyes   ROBOT ASSISTED PYELOPLASTY Right 04/28/2013   Procedure: ROBOTIC ASSISTED PYELOPLASTY/ RIGHT PYELOLITHOTOMY;  Surgeon: Kristeen Peto, MD;  Location: [facility];  Service: Urology;  Laterality: Right;    Family History  Problem Relation Age of Onset   GER disease Father    Gallbladder disease Father    Colon cancer Neg Hx    Colon polyps Neg Hx     Allergies as of 11/05/2023 - Review Complete 11/05/2023  Allergen Reaction Noted   Codeine Anaphylaxis and Swelling 04/30/2012   Toradol  [ketorolac  tromethamine ] Anaphylaxis and Swelling 04/18/2013   Barium-containing compounds Swelling and Rash 04/24/2013   Tramadol Nausea And Vomiting 02/13/2013    Social History   Socioeconomic History   Marital status: Married    Spouse name: Not on file   Number of children: Not on file   Years of education: Not on file   Highest education level: Not on file  Occupational History   Not on file   Tobacco Use   Smoking status: Former    Current packs/day: 0.00    Average packs/day: 0.5 packs/day for 2.0 years (1.0 ttl pk-yrs)    Types: Cigarettes    Quit date: 09/04/2021    Years since quitting: 2.1   Smokeless tobacco: Never  Vaping Use   Vaping status: Some Days  Substance and Sexual Activity   Alcohol use: No   Drug use: Yes    Types: Marijuana    Comment: 1-2 times a week   Sexual activity: Yes    Birth control/protection: None  Other Topics Concern   Not on file  Social History Narrative   Not on file   Social Drivers of Health   Financial Resource Strain: Not on file  Food Insecurity: Not on file  Transportation Needs: Not on file  Physical Activity: Not on file  Stress: Not on file  Social Connections: Not on file     Review of Systems   Gen: Denies fever, chills, anorexia. Denies fatigue, weakness, weight loss.  CV: Denies chest pain, palpitations, syncope, peripheral edema, and claudication. Resp: Denies dyspnea at rest, cough, wheezing, coughing up blood, and pleurisy. GI: See HPI Derm: Denies rash, itching, dry skin Psych: Denies depression, anxiety, memory loss, confusion. No homicidal or suicidal ideation.  Heme: Denies bruising, bleeding, and enlarged lymph nodes.  Physical Exam   BP 115/77   Pulse 62   Temp 98.2 F (36.8 C)   Ht 5\' 7"  (1.702 m)   Wt 157 lb 12.8 oz (71.6 kg)   BMI 24.71 kg/m   General:   Alert and oriented. No distress noted. Pleasant and cooperative.  Head:  Normocephalic and atraumatic. Eyes:  Conjuctiva clear without scleral icterus. Mouth:  Oral mucosa pink and moist. Good dentition. No lesions. Lungs:  Clear to auscultation bilaterally. No wheezes, rales, or rhonchi. No distress.  Heart:  S1, S2 present without murmurs appreciated.  Abdomen:  +BS, soft, non-tender and non-distended. No rebound or guarding. No HSM or masses noted. Rectal: deferred Msk:  Symmetrical without gross deformities. Normal  posture. Extremities:  Without edema. Neurologic:  Alert and  oriented x4 Psych:  Alert and cooperative. Normal mood and affect.  Assessment  Steve Colon is a 28 y.o. male with a history of kidney stones, GERD, chronic nausea/vomiting with abdominal pain presenting today for follow-up of chronic GI symptoms.   Nausea/vomiting, GERD,  epigastric burning, bloating, early satiety:  - Has continuous bloating feeling as well as early satiety and continues to have intermittent frequent epigastric burning especially post meals -EGD performed with H. pylori gastritis noted -Previously on Nexium  twice daily. -Advised famotidine as needed for breakthrough symptoms currently while awaiting H. pylori test -Previously given trial of Reglan  however he did not pick this up as he was unsure if he could take it when he went to go do his breath test.  Encouraged him to go ahead and try this and rediscussed potential side effects today.  This would hopefully give us  some insight into if gastroparesis could be potential cause. -Discussed dietary changes today including low fiber -Given his chronic nausea, intermittent vomiting, bloating, and early satiety gastroparesis remains highly differential however likely would not be able to perform GES given his oral barium allergy.  For his pain we have discussed evaluating with a CT of the abdomen pelvis and given the chronic reflux symptoms have discussed potential pH impedance and manometry.  Provided information about this today given he has a history of some mild intermittent dysphagia -If not inclined to do CT A/P or pH impedance testing, may need to consider neuromodulator for improvement of symptoms  H. pylori gastritis:  -Confirmed by EGD, treated with bismuth , doxycycline , metronidazole , and pantoprazole  40 mg BID - Previously was taking Nexium  as well as pantoprazole  therefore advised only take 1 PPI - Last visit in March she had been without medication since  the end of February to perform breath test but he had not yet completed.  Unfortunately when he went to schedule his breath test he was told it would be $800 as his insurance would not cover it. - Will work on getting affordable testing evaluation and if needed will send appeal to insurance for coverage as we need documented eradication - Continues to have some upper GI symptoms as above.  Will order stool testing as well as breath test and go with the cheaper route of coverage. - Will retreat if H. pylori remains positive - Some of his upper GI complaints and bloating could be secondary to gastroparesis.  PLAN   Reglan  5 mg BID, preferably before meal.  Counseled on side effects. Dicyclomine  10 mg up to twice a day as needed for abdominal pain.  Counseled on side effects and advised not to take in conjunction with Reglan  as it may reduce its effects.  Zofran  as needed H. pylori breath test and stool test -we will perform the cheapest option. Zofran  as needed.  Refilled. Famotidine as needed for reflux symptoms until breath test performed Hold Nexium  for now until H. pylori status known. Discussed other methods of evaluation including CT A/P, pH impedance and manometry, and gastric emptying study with water  soluble contrast material (Gastrografin or diatrizoate).  Advised low fiber diet for now. Follow up in 3 months.    Julian Obey, MSN, FNP-BC, AGACNP-BC [facility]

## 2023-11-05 ENCOUNTER — Ambulatory Visit (INDEPENDENT_AMBULATORY_CARE_PROVIDER_SITE_OTHER): Admitting: Gastroenterology

## 2023-11-05 ENCOUNTER — Encounter: Payer: Self-pay | Admitting: Gastroenterology

## 2023-11-05 VITALS — BP 115/77 | HR 62 | Temp 98.2°F | Ht 67.0 in | Wt 157.8 lb

## 2023-11-05 DIAGNOSIS — K219 Gastro-esophageal reflux disease without esophagitis: Secondary | ICD-10-CM

## 2023-11-05 DIAGNOSIS — R140 Abdominal distension (gaseous): Secondary | ICD-10-CM

## 2023-11-05 DIAGNOSIS — K2970 Gastritis, unspecified, without bleeding: Secondary | ICD-10-CM

## 2023-11-05 DIAGNOSIS — R1013 Epigastric pain: Secondary | ICD-10-CM | POA: Diagnosis not present

## 2023-11-05 DIAGNOSIS — R6881 Early satiety: Secondary | ICD-10-CM

## 2023-11-05 DIAGNOSIS — R112 Nausea with vomiting, unspecified: Secondary | ICD-10-CM | POA: Diagnosis not present

## 2023-11-05 DIAGNOSIS — B9681 Helicobacter pylori [H. pylori] as the cause of diseases classified elsewhere: Secondary | ICD-10-CM

## 2023-11-05 DIAGNOSIS — R1010 Upper abdominal pain, unspecified: Secondary | ICD-10-CM

## 2023-11-05 DIAGNOSIS — A048 Other specified bacterial intestinal infections: Secondary | ICD-10-CM

## 2023-11-05 MED ORDER — ONDANSETRON 8 MG PO TBDP: 8.0000 mg | ORAL_TABLET | Freq: Two times a day (BID) | ORAL | 1 refills | Status: DC | PRN

## 2023-11-05 MED ORDER — DICYCLOMINE HCL 10 MG PO CAPS: 10.0000 mg | ORAL_CAPSULE | Freq: Two times a day (BID) | ORAL | 1 refills | Status: DC | PRN

## 2023-11-05 NOTE — Patient Instructions (Addendum)
 I do want you to begin taking the Reglan  up to twice a day. Contact your care team immediately if you start having movements you cannot control such as lip smacking, rapid movements of the tongue, involuntary or uncontrollable movements of the eyes, head, arms and legs, or muscle twitches and spasms. Stop taking if this occurs.  Patients and their families should watch out for worsening depression or thoughts of suicide. Also watch out for any sudden or severe changes in feelings such as feeling anxious, agitated, panicky, irritable, hostile, aggressive, impulsive, severely restless, overly excited and hyperactive, or not being able to sleep. If this happens, especially at the beginning of treatment or after a change in dose please let me know.   For the abdominal pain while you are at work I want you to take dicyclomine  twice a day as needed.  Please be aware of constipation with this medication. It is possible that the dicyclomine  could lessen the effectiveness of reglan . If the dicyclomine  has been known to make you sleepy or dizzy then do not take while at work.    Sit up or stand slowly to reduce the risk of dizzy or fainting spells. Drinking alcohol with this medication can increase the risk of these side effects. Stay out of bright light and wear sunglasses if this medication makes your eyes more sensitive to light. Avoid extreme heat (hot tubs, saunas). This medication can cause you to sweat less than normal. Your body temperature could increase to dangerous levels, which may lead to heat stroke. Your mouth may get dry. Chewing sugarless gum or sucking hard candy and drinking plenty of water  may help. Contact your care team if the problem does not go away or is severe. I have ordered both the stool test as well as the breath test to reassess you for H. pylori.  Please let me know if there is an issue with the lab in regards to cost and or availability to perform it.  If insurance will not cover 1  of these you can call them and ask for an appeal and I can write a letter on your behalf if needed.  I will work towards seeing if there are any other testing options that may be more affordable. I have refilled your Zofran  for you, you may use this up to 3 times a day as needed for severe nausea and vomiting.  While you are waiting to get the breath test you may take famotidine just do not take the day of the breath test and hold for 2 days prior to doing the stool test. I have attached information about a low fiber and high-fiber diet.  I want you to try more of a low fiber diet however some of the higher fiber vegetables you can still eat however I would recommend cooking them so they are softer and easier to digest.  If gastroparesis is causing some of your symptoms then it is important to follow a low fiber diet and try to eat more frequently, 4-6 small meals per day (snacking but also focusing on protein intake).  Other options for evaluation of your symptoms: CT of the abdomen pelvis with contrast Gastric emptying study -off Reglan . pH impedance/manometry off medication Follow up in 3 months.  It was a pleasure to see you today. I want to create trusting relationships with patients. If you receive a survey regarding your visit,  I greatly appreciate you taking time to fill this out on paper or through  your MyChart. I value your feedback.  Julian Obey, MSN, FNP-BC, AGACNP-BC [facility]

## 2023-11-27 ENCOUNTER — Other Ambulatory Visit: Payer: Self-pay | Admitting: Gastroenterology

## 2023-11-27 DIAGNOSIS — R1010 Upper abdominal pain, unspecified: Secondary | ICD-10-CM

## 2023-12-03 NOTE — Telephone Encounter (Signed)
 The information is in the attachment that he sent with the original message.

## 2024-02-06 ENCOUNTER — Ambulatory Visit: Admitting: Gastroenterology

## 2024-02-26 ENCOUNTER — Other Ambulatory Visit: Payer: Self-pay | Admitting: Gastroenterology

## 2024-02-26 DIAGNOSIS — A048 Other specified bacterial intestinal infections: Secondary | ICD-10-CM

## 2024-03-02 LAB — H. PYLORI ANTIGEN, STOOL: H pylori Ag, Stl: NEGATIVE

## 2024-03-03 ENCOUNTER — Ambulatory Visit: Payer: Self-pay | Admitting: Gastroenterology

## 2024-03-06 ENCOUNTER — Encounter: Payer: Self-pay | Admitting: Gastroenterology

## 2024-03-06 ENCOUNTER — Telehealth (INDEPENDENT_AMBULATORY_CARE_PROVIDER_SITE_OTHER): Admitting: Gastroenterology

## 2024-03-06 DIAGNOSIS — R1010 Upper abdominal pain, unspecified: Secondary | ICD-10-CM

## 2024-03-06 DIAGNOSIS — K219 Gastro-esophageal reflux disease without esophagitis: Secondary | ICD-10-CM | POA: Diagnosis not present

## 2024-03-06 DIAGNOSIS — R109 Unspecified abdominal pain: Secondary | ICD-10-CM | POA: Diagnosis not present

## 2024-03-06 DIAGNOSIS — A048 Other specified bacterial intestinal infections: Secondary | ICD-10-CM

## 2024-03-06 DIAGNOSIS — R197 Diarrhea, unspecified: Secondary | ICD-10-CM

## 2024-03-06 DIAGNOSIS — R1011 Right upper quadrant pain: Secondary | ICD-10-CM

## 2024-03-06 DIAGNOSIS — R112 Nausea with vomiting, unspecified: Secondary | ICD-10-CM | POA: Diagnosis not present

## 2024-03-06 MED ORDER — ONDANSETRON 8 MG PO TBDP: 8.0000 mg | ORAL_TABLET | Freq: Two times a day (BID) | ORAL | 1 refills | Status: AC | PRN

## 2024-03-06 MED ORDER — ESOMEPRAZOLE MAGNESIUM 40 MG PO CPDR: 40.0000 mg | DELAYED_RELEASE_CAPSULE | Freq: Two times a day (BID) | ORAL | 3 refills | Status: AC

## 2024-03-06 NOTE — Progress Notes (Signed)
 Primary Care Physician:  Patient, No Pcp Per  Primary Gastroenterologist: Muhammad Faizan Ahmed, MD  Patient Location: Home Reason for Visit: follow up - ongoing nausea - need for FMLA paperwork updated Provider Location: [facility]  Persons present on the virtual encounter, with roles: Patient - Steve Colon; Provider - Charmaine Melia, NP   Total time (minutes) spent on medical discussion: 23 minutes  Virtual Visit Encounter Note Visit is conducted virtually and was requested by patient.   I connected with Steve Colon on 03/06/24 at  3:30 PM EDT by video and verified that I am speaking with the correct person using two identifiers.   I discussed the limitations, risks, security and privacy concerns of performing an evaluation and management service by video and the availability of in person appointments. I also discussed with the patient that there may be a patient responsible charge related to this service. The patient expressed understanding and agreed to proceed.  Chief Complaint  Patient presents with   Follow-up    Still having issues with nausea and vomiting     History of Present Illness: Steve Colon is a 28 y.o. male with a history of kidney stones, GERD, chronic nausea/vomiting with abdominal pain presenting today virtually to discuss ongoing nausea and vomiting and in need of updated FMLA for work.  Urgent care visit in early July 2024 for nausea and vomiting.   Initial office visit 01/11/2023.  Patient had a month-long of nausea/vomiting or regurgitation after meals.  This is associated with epigastric discomfort as well as pain.  This occurs with almost every meal.  Has only been able to tolerate snack types of meals, not larger meals.  Continues to have epigastric pain/pressure even after vomiting but nausea will subside.  Had not had any improvement with famotidine twice daily or low-dose Zofran .  Denies any dysphagia.  Given his decreased  p.o. intake he did report some weight loss.  Had reported ibuprofen  use, to tablets daily for couple of months.  Currently vaping, former smoker.  Advised to avoid all NSAIDs, follow GERD diet, start pantoprazole  40 mg twice daily and continue famotidine as needed for breakthrough.  Zofran  refilled.  FMLA paperwork filled out.  Advised to complete labs and ultrasound if no improvement in 1-2 weeks.   RUQ US  05/23/23: - CBD 3.10mm - No cholelithiasis or choledocholithiasis   OV 05/24/23 virtually.  Admitted to vaping.  States he quit smoking cigarettes in March or 2023.  Still waking in the mornings with dry heaving and vomiting.  Stomach feels on fire after eating anything.  If he eats a large meal he has abdominal pain within an hour and the next morning he has dry heaves and vomits up stomach acid.  Taking Nexium  twice daily.  Avoiding NSAIDs.  Rare alcohol use..   EGD 07/09/23: - 3 cm hiatal hernia.  - Erythematous mucosa in the antrum. Biopsied.  - Normal duodenal bulb and second portion of the duodenum. Biopsied.  - Biopsies were taken with a cold forceps for evaluation of eosinophilic esophagitis. - Path: Small bowel biopsies with foveolar metaplasia consistent with chronic peptic duodenitis, gastric biopsies with chronic inactive gastritis and H. pylori identified, esophageal biopsies normal. - Advised of biopsies negative, would benefit from GES and 24-hour pH impedance.  Also advised smoking cessation. -Bismuth , doxycycline , Nexium , and metronidazole  sent in for patient.   On 1/27 instructed patient to pick up medications at pharmacy for him and resume his PPI. On  1/31 he reported pain was worsening along with vomiting. Still occurring 2/10 therefore offered dicyclomine  but weary of side effects therefore he declined.  CT offered.  Also reported diarrhea for the last week with bleeding on his bottom.  Advised a brat diet.  Advised sink paste and keeping area dry if skin is raw, if rectal  bleeding then advised office visit.   Advised him to stop Nexium  on 2/27 and go on 3/14 to do the breath test.  Instructed Carafate  and famotidine as needed during this time.  Filled out some short-term disability paperwork for patient given frequently out of work due to severe nausea, vomiting, and abdominal pain.   OV 09/10/23.  Patient reported that after his upper endoscopy the physician told him there was nothing to be alarmed about and he should stop taking his medication so he was confused given the Nexium .  Had sensation of food getting stuck mid chest had been going on for a long time.  Reports sharp shooting pain in his chest sometimes will be so tight for a minute or less and then can occur multiple times per day.  Usually will have to hold his breath that this happens and is becoming more frequent.  Taking Pepto-Bismol frequently.  Feeling bloated after eating a chicken patty the day prior.  Symptoms you have little snacks like peanut butter crackers he feels bloated and distended.  Reports extremely gassy and belching lots.  Advised to perform the H. pylori breath test given he had been off medication.  Advised would retreat with optimized bismuth  quadruple therapy with tetracycline if his breath test remain positive.  Advised CT abdomen pelvis if ongoing symptoms.  Discussed Reglan  and ordered 5 mg twice daily before meals and counseled on his side effects.  Advised pH impedance and manometry if having ongoing symptoms.  Separate written education provided.  Last OV 11/05/23.  Having reflux symptoms for about 1 -1.5 weeks with a burning in his throat which was worse over the 3 days prior.  Noticed with emesis it was mostly stomach acid but no food.  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  Having lots of pressure which hurts more in his mid abdomen through the epigastric region and can be a sharp  stabbing pain at times.  At times it has some dry heaving as well.  Usually only eating about 1 meal per day.  Given Reglan  5 mg twice daily before meals and counseled on side effects.  Given dicyclomine  10 mg to use twice daily for abdominal pain and also discussed side effects.  Advised to perform H. pylori testing.  Zofran  as needed.  Advised famotidine for reflux symptoms until breath test performed.  Advised CT abdomen pelvis, pH impedance and manometry testing as well as GES with water -soluble contrast for further workup.  Also advised low fiber diet.  Previously taking Nexium , advised to hold this for breath testing.  Recent H. pylori stool test negative.  Today:  Discussed the use of AI scribe software for clinical note transcription with the patient, who gave verbal consent to proceed.  He experiences persistent nausea and abdominal pain, described as coming in waves rather than being constant. The nausea and stomach pain are severe upon waking when it occurs, temporarily improving with medication but then returning. He also reports a burning sensation in his stomach. When nausea gets severe is when he vomits.  He describes a new symptom of pressure pain in the mid-right abdomen - mostly upper quadrant in nature, likening it to 'somebody's got his elbow in the middle lower right side' of his stomach. This pain is intermittent and uncomfortable, not related to meals.  He is currently taking Reglan , Nexium , and dicyclomine , although he has not been using dicyclomine  or nexium  while holding for the 2 weeks for H. Pylori testing. He resumed Nexium  after providing a stool sample. Dicyclomine  previously caused some mild drowsiness, which is concerning given his work requirements. Does not take it at work.   Had worsening epigastric burning and nausea/vomiting while off of Nexium  for his 2 weeks to perform his H. pylori testing.  He experiences loose stools. Has done Activia yogurt but not helping  consistency. The loose stools occur a couple times per day, not excessive and began while taking his H. Pylori treatment and have continued even after finishing.   His symptoms can become severe enough at work that he feels the need to leave, occurring about three to four times a month.       Medications Current Meds  Medication Sig   dicyclomine  (BENTYL ) 10 MG capsule TAKE 1 CAPSULE (10 MG TOTAL) BY MOUTH 2 (TWO) TIMES DAILY AS NEEDED FOR SPASMS.   esomeprazole  (NEXIUM ) 40 MG capsule Take 1 capsule (40 mg total) by mouth 2 (two) times daily before a meal.   metoCLOPramide  (REGLAN ) 5 MG tablet Take 1 tablet (5 mg total) by mouth 2 (two) times daily.   ondansetron  (ZOFRAN  ODT) 8 MG disintegrating tablet Take 1 tablet (8 mg total) by mouth every 12 (twelve) hours as needed for nausea or vomiting.     History Past Medical History:  Diagnosis Date   Anxiety    Headache    Kidney stones     Past Surgical History:  Procedure Laterality Date   BIOPSY  07/09/2023   Procedure: BIOPSY;  Surgeon: Cinderella Deatrice FALCON, MD;  Location: [facility];  Service: Endoscopy;;   CYSTOSCOPY W/ URETERAL STENT PLACEMENT Right 04/28/2013   Procedure: CYSTOSCOPY WITH RIGHT RETROGRADE PYELOGRAM/RIGHT URETERAL STENT PLACEMENT;  Surgeon: Noretta Ferrara, MD;  Location: [facility];  Service: Urology;  Laterality: Right;   ESOPHAGOGASTRODUODENOSCOPY (EGD) WITH PROPOFOL  N/A 07/09/2023   Procedure: ESOPHAGOGASTRODUODENOSCOPY (EGD) WITH PROPOFOL ;  Surgeon: Cinderella Deatrice FALCON, MD;  Location: [facility];  Service: Endoscopy;  Laterality: N/A;  11:30 am, asa 2   EYE SURGERY  4 yrs ago   to straighte eyes   ROBOT ASSISTED PYELOPLASTY Right 04/28/2013   Procedure: ROBOTIC ASSISTED PYELOPLASTY/ RIGHT PYELOLITHOTOMY;  Surgeon: Noretta Ferrara, MD;  Location: [facility];  Service: Urology;  Laterality: Right;    Family History  Problem Relation Age of Onset   GER disease Father    Gallbladder disease Father    Colon cancer Neg Hx     Colon polyps Neg Hx     Social History   Socioeconomic History   Marital status: Married    Spouse name: Not on file   Number of children: Not on file   Years of education: Not on file   Highest education level: Not on file  Occupational History   Not on file  Tobacco Use   Smoking status: Former    Current packs/day: 0.00    Average packs/day: 0.5 packs/day for 2.0 years (1.0 ttl pk-yrs)    Types: Cigarettes    Quit date: 09/04/2021    Years since quitting: 2.5   Smokeless  tobacco: Never  Vaping Use   Vaping status: Some Days  Substance and Sexual Activity   Alcohol use: No   Drug use: Yes    Types: Marijuana    Comment: 1-2 times a week   Sexual activity: Yes    Birth control/protection: None  Other Topics Concern   Not on file  Social History Narrative   Not on file   Social Drivers of Health   Financial Resource Strain: Not on file  Food Insecurity: Not on file  Transportation Needs: Not on file  Physical Activity: Not on file  Stress: Not on file  Social Connections: Not on file      Review of Systems: Gen: Denies fever, chills, anorexia. Denies fatigue, weakness, weight loss.  CV: Denies chest pain, palpitations, syncope, peripheral edema, and claudication. Resp: Denies dyspnea at rest, cough, wheezing, coughing up blood, and pleurisy. GI: see HPI Derm: Denies rash, itching, dry skin Psych: Denies depression, anxiety, memory loss, confusion. No homicidal or suicidal ideation.  Heme: Denies bruising, bleeding, and enlarged lymph nodes.  Observations/Objective: No distress. Alert and oriented. Pleasant. Well nourished. Normal mood and affect. Unable to perform complete physical exam due to video encounter.   Assessment/Plan:     Chronic nausea/vomiting and abdominal pain Intermittent nausea, vomiting, and abdominal pain, primarily in the morning with temporary relief from medication when it occurs. New symptom of pressure pain in the mid-right  abdomen, described as feeling like an elbow pressing. Symptoms are not constant but occur in spurts. Differential includes gallbladder dysfunction, GERD, CHS, and functional dyspepsia. Consideration of gallbladder dysfunction due to mid-right abdominal pressure pain despite normal ultrasound and biliary duct appearance. Discussed potential for functional gut-brain interaction disorders if other causes are ruled out. - Continue Reglan  and dicyclomine  as needed, avoiding dicyclomine  at work due to sedative effects - Consider increasing Reglan  to 10 mg if comfortable with this. for now, keep at 5mg  BID - Zofran  as needed - refilled.  - Order HIDA scan to evaluate gallbladder function - Hold Reglan  for a couple of days before HIDA scan - Consider pH impedance test or manometry if HIDA scan is inconclusive - May need to consider TCA or Buspar for chronic nausea/vomiting or consider GES  Given interruption of life/work given chronic symptoms will fill out FMLA paperwork as appropriate for patient.   Gastroesophageal reflux disease (GERD), history of H. Pylori Persistent nausea and burning stomach pain suggestive of GERD. Previous treatment with Nexium  provided some relief. Discussed potential for non-acid reflux and the need for further evaluation if symptoms persist. Recent H. pylori stool antigen test negative.  Successfully treated with bismuth  quadruple therapy.  - Continue Nexium  40 mg BID - refilled.  - Consider pH impedance test or manometry to evaluate for acid vs. non-acid reflux  Diarrhea, possibly antibiotic-associated or medication-related Intermittent diarrhea, possibly related to recent antibiotic use or medication side effects from Reglan  or dicyclomine . Occurs less than five to six times a day. Yogurt tried with no significant improvement. Discussed the possibility of gallbladder-related diarrhea and the impact of medications on gut motility. - Consider taking Imodium if diarrhea becomes  severe - Continue dicyclomine  as needed, especially after work if no side effects - Consider Cdiff testing if foul odor or increased frequency occurs.      Follow Up Instructions:  Follow up 3 months, sooner as needed pending results.   I discussed the assessment and treatment plan with the patient. The patient was provided an opportunity to ask  questions and all were answered. The patient agreed with the plan and demonstrated an understanding of the instructions.   The patient was advised to call back or seek an in-person evaluation if the symptoms worsen or if the condition fails to improve as anticipated.   Charmaine Melia, MSN, APRN, FNP-BC, AGACNP-BC [facility]

## 2024-03-06 NOTE — Progress Notes (Signed)
 error

## 2024-03-07 ENCOUNTER — Other Ambulatory Visit: Payer: Self-pay | Admitting: *Deleted

## 2024-03-07 ENCOUNTER — Encounter: Payer: Self-pay | Admitting: *Deleted

## 2024-03-07 DIAGNOSIS — R112 Nausea with vomiting, unspecified: Secondary | ICD-10-CM

## 2024-03-07 DIAGNOSIS — R1011 Right upper quadrant pain: Secondary | ICD-10-CM

## 2024-03-10 ENCOUNTER — Encounter: Payer: Self-pay | Admitting: *Deleted

## 2024-03-10 DIAGNOSIS — Z0279 Encounter for issue of other medical certificate: Secondary | ICD-10-CM

## 2024-03-10 NOTE — Telephone Encounter (Signed)
 I took care of it. I let Steve Colon know, so she can call him

## 2024-03-12 ENCOUNTER — Encounter (HOSPITAL_COMMUNITY)

## 2024-03-18 ENCOUNTER — Ambulatory Visit: Admitting: Gastroenterology
